# Patient Record
Sex: Male | Born: 2000
Health system: Southern US, Community
[De-identification: ages and names within clinical notes are randomized; demographics above are authoritative.]

## PROBLEM LIST (undated history)

## (undated) DIAGNOSIS — Z789 Other specified health status: Secondary | ICD-10-CM

## (undated) HISTORY — PX: NO PAST SURGERIES: SHX2092

---
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
## 2020-03-03 ENCOUNTER — Telehealth: Payer: Self-pay | Admitting: Pharmacy Technician

## 2020-03-03 ENCOUNTER — Ambulatory Visit: Payer: Medicaid Other

## 2020-03-03 NOTE — Telephone Encounter (Signed)
RCID Patient Product/process development scientist completed.    The patient is insured through [employer] and has a $0 copay.    Nathan Tucker. Nathan Tucker CPhT Specialty Pharmacy Patient [facility] for Infectious Disease Phone: [phone] Fax:  [phone]

## 2020-03-16 ENCOUNTER — Ambulatory Visit: Payer: Medicaid Other | Admitting: Internal Medicine

## 2020-05-23 ENCOUNTER — Emergency Department (HOSPITAL_COMMUNITY)
Admission: EM | Admit: 2020-05-23 | Discharge: 2020-05-23 | Discharge status: Against Medical Advice | Payer: Medicaid Other

## 2020-05-23 ENCOUNTER — Other Ambulatory Visit: Payer: Self-pay

## 2020-05-27 ENCOUNTER — Other Ambulatory Visit: Payer: Self-pay

## 2020-05-27 ENCOUNTER — Ambulatory Visit (HOSPITAL_COMMUNITY)
Admission: EM | Admit: 2020-05-27 | Discharge: 2020-05-27 | Disposition: A | Discharge status: Home / Self Care | Payer: Medicaid Other | Attending: Family Medicine | Admitting: Family Medicine

## 2020-05-27 ENCOUNTER — Encounter (HOSPITAL_COMMUNITY): Payer: Self-pay | Admitting: Emergency Medicine

## 2020-05-27 DIAGNOSIS — Z202 Contact with and (suspected) exposure to infections with a predominantly sexual mode of transmission: Secondary | ICD-10-CM

## 2020-05-27 DIAGNOSIS — B20 Human immunodeficiency virus [HIV] disease: Secondary | ICD-10-CM

## 2020-05-27 LAB — HIV ANTIBODY (ROUTINE TESTING W REFLEX): HIV Screen 4th Generation wRfx: REACTIVE — AB

## 2020-05-27 MED ORDER — LIDOCAINE HCL (PF) 1 % IJ SOLN
INTRAMUSCULAR | Status: AC
  Filled 2020-05-27: qty 2

## 2020-05-27 MED ORDER — CEFTRIAXONE SODIUM 500 MG IJ SOLR
INTRAMUSCULAR | Status: AC
  Filled 2020-05-27: qty 500

## 2020-05-27 MED ORDER — CEFTRIAXONE SODIUM 500 MG IJ SOLR
500.0000 mg | Freq: Once | INTRAMUSCULAR | Status: AC
  Administered 2020-05-27: 500 mg via INTRAMUSCULAR

## 2020-05-27 NOTE — Discharge Instructions (Signed)
Please schedule an appointment and follow up as soon as possible with Infectious Disease  [facility] [address] [place], [place] [address] [phone]

## 2020-05-27 NOTE — ED Provider Notes (Signed)
[facility]    CSN: [id number] Arrival date & time: 05/27/20  1159      History   Chief Complaint Chief Complaint  Patient presents with  . Exposure to STD    HPI Nathan Tucker is a 19 y.o. male.   Here today concerned about possible gonorrhea infection as his sexual partner just tested positive last week and they have had unprotected sexual interaction. He is currently asymptomatic. Denies penile discharge, rashes, fever, pelvic or abdominal pain, N/V.      Past Medical History:  Diagnosis Date  . No pertinent past medical history     Patient Active Problem List   Diagnosis Date Noted  . Intractable vomiting with nausea 11/23/2019  . Intractable vomiting   . Left upper quadrant abdominal pain     Past Surgical History:  Procedure Laterality Date  . NO PAST SURGERIES         Home Medications    Prior to Admission medications   Medication Sig Start Date End Date Taking? Authorizing Provider  bictegravir-emtricitabine-tenofovir AF (BIKTARVY) 50-200-25 MG TABS tablet Take 1 tablet by mouth daily. 11/30/19   Randall Hiss, MD  capsaicin (ZOSTRIX) 0.025 % cream Apply topically 2 (two) times daily. 11/24/19   Mullis, Kiersten P, DO  dicyclomine (BENTYL) 20 MG tablet Take 1 tablet (20 mg total) by mouth 2 (two) times daily. 11/23/19   Joy, Shawn C, PA-C  ondansetron (ZOFRAN ODT) 4 MG disintegrating tablet Take 1 tablet (4 mg total) by mouth every 8 (eight) hours as needed for nausea or vomiting. 11/23/19   Joy, Hillard Danker, PA-C    Family History History reviewed. No pertinent family history.  Social History Social History   Tobacco Use  . Smoking status: Never Smoker  . Smokeless tobacco: Never Used  Substance Use Topics  . Alcohol use: Yes    Comment: pt reports 40 shots, $300 tab   . Drug use: Yes    Frequency: 2.0 times per week    Types: Marijuana     Allergies   Patient has no known allergies.   Review of Systems Review of  Systems PER HPI    Physical Exam Triage Vital Signs ED Triage Vitals  Enc Vitals Group     BP 05/27/20 1238 139/76     Pulse Rate 05/27/20 1238 74     Resp 05/27/20 1238 16     Temp 05/27/20 1238 99.2 F (37.3 C)     Temp Source 05/27/20 1238 Oral     SpO2 05/27/20 1238 98 %     Weight --      Height --      Head Circumference --      Peak Flow --      Pain Score 05/27/20 1237 0     Pain Loc --      Pain Edu? --      Excl. in GC? --    No data found.  Updated Vital Signs BP 139/76 (BP Location: Right Arm)   Pulse 74   Temp 99.2 F (37.3 C) (Oral)   Resp 16   SpO2 98%   Visual Acuity Right Eye Distance:   Left Eye Distance:   Bilateral Distance:    Right Eye Near:   Left Eye Near:    Bilateral Near:     Physical Exam Vitals and nursing note reviewed.  Constitutional:      Appearance: Normal appearance.  HENT:     Head:  Atraumatic.  Eyes:     Extraocular Movements: Extraocular movements intact.     Conjunctiva/sclera: Conjunctivae normal.  Cardiovascular:     Rate and Rhythm: Normal rate and regular rhythm.  Pulmonary:     Effort: Pulmonary effort is normal.     Breath sounds: Normal breath sounds.  Abdominal:     General: Bowel sounds are normal. There is no distension.     Palpations: Abdomen is soft.     Tenderness: There is no abdominal tenderness. There is no right CVA tenderness, left CVA tenderness or guarding.  Musculoskeletal:        General: Normal range of motion.     Cervical back: Normal range of motion and neck supple.  Skin:    General: Skin is warm and dry.  Neurological:     General: No focal deficit present.     Mental Status: He is oriented to person, place, and time.  Psychiatric:        Mood and Affect: Mood normal.        Thought Content: Thought content normal.        Judgment: Judgment normal.    UC Treatments / Results  Labs (all labs ordered are listed, but only abnormal results are displayed) Labs Reviewed  HIV  ANTIBODY (ROUTINE TESTING W REFLEX)  RPR  CYTOLOGY, (ORAL, ANAL, URETHRAL) ANCILLARY ONLY    EKG   Radiology No results found.  Procedures Procedures (including critical care time)  Medications Ordered in UC Medications  cefTRIAXone (ROCEPHIN) injection 500 mg (500 mg Intramuscular Given 05/27/20 1302)    Initial Impression / Assessment and Plan / UC Course  I have reviewed the triage vital signs and the nursing notes.  Pertinent labs & imaging results that were available during my care of the patient were reviewed by me and considered in my medical decision making (see chart for details).     Given close known exposure to gonorrhea, will administer IM rocephin today in clinic. Aptima swab and blood work pending to r/o other STIs. Of note, on record review it appears he was informed of new onset HIV infection back in April and has not yet followed up with infectious disease despite numerous attempts on their behalf to connect with him. Stressed the importance of this to him and provided contact information for him to schedule an appt ASAP. safe sexual practices discussed.   Final Clinical Impressions(s) / UC Diagnoses   Final diagnoses:  STD exposure  History of HIV infection [facility])     Discharge Instructions     Please schedule an appointment and follow up as soon as possible with Infectious Disease  [facility] [address] [place], [place] [address] [phone]    ED Prescriptions    None     PDMP not reviewed this encounter.   Particia Nearing, [place] 05/27/20 1357

## 2020-05-27 NOTE — ED Triage Notes (Signed)
Pt presents with STD exposure. States partner tested positive. Last exposed on Tuesday.

## 2020-05-28 LAB — RPR: RPR Ser Ql: NONREACTIVE

## 2020-05-29 LAB — CYTOLOGY, (ORAL, ANAL, URETHRAL) ANCILLARY ONLY
Chlamydia: NEGATIVE
Comment: NEGATIVE
Comment: NEGATIVE
Comment: NORMAL
Neisseria Gonorrhea: NEGATIVE
Trichomonas: NEGATIVE

## 2020-05-30 ENCOUNTER — Encounter: Payer: Self-pay | Admitting: Infectious Disease

## 2020-05-30 ENCOUNTER — Telehealth: Payer: Self-pay | Admitting: Infectious Disease

## 2020-05-30 DIAGNOSIS — A549 Gonococcal infection, unspecified: Secondary | ICD-10-CM

## 2020-05-30 DIAGNOSIS — B20 Human immunodeficiency virus [HIV] disease: Secondary | ICD-10-CM

## 2020-05-30 HISTORY — DX: Gonococcal infection, unspecified: A54.9

## 2020-05-30 HISTORY — DX: Human immunodeficiency virus (HIV) disease: B20

## 2020-05-30 LAB — HIV-1/2 AB - DIFFERENTIATION
HIV 1 Ab: POSITIVE — AB
HIV 2 Ab: NEGATIVE

## 2020-05-30 NOTE — Telephone Encounter (Signed)
  Have been trying to engage in care at [facility] since April of 2021  Tammy Sours probably already reached out to you MIchelle  DIS to try again?  Maybe next time in ER they might talk to him as well

## 2020-06-03 ENCOUNTER — Telehealth: Payer: Self-pay | Admitting: *Deleted

## 2020-06-03 NOTE — Telephone Encounter (Signed)
-----   Message from Veryl Speak, FNP sent at 05/30/2020  4:29 PM EDT ----- Looks like we may have tried to engage in care but has not followed up.   Thanks!

## 2020-06-03 NOTE — Telephone Encounter (Signed)
Main phone number listed is patient's mother.  She offered to take a message because patient doesn't have a phone right now and isn't with her (she has his SIM card until Monday). RN declined, his mother asked if I could call back Monday afternoon.  RN agreed. Andree Coss, RN

## 2020-06-06 NOTE — Telephone Encounter (Signed)
THx Michelle~!

## 2020-06-13 NOTE — Telephone Encounter (Signed)
Attempted to call patient at number listed. Call went to voicemail that was not yet set up. Andree Coss, RN

## 2020-07-20 NOTE — Telephone Encounter (Signed)
Left message on mobile number asking Kermit to call his doctor's office. Will send to [facility]. Andree Coss, RN

## 2020-09-21 ENCOUNTER — Encounter: Payer: Self-pay | Admitting: Physician Assistant

## 2020-09-21 ENCOUNTER — Telehealth: Payer: Medicaid Other | Admitting: Physician Assistant

## 2020-09-21 DIAGNOSIS — B20 Human immunodeficiency virus [HIV] disease: Secondary | ICD-10-CM

## 2020-09-21 NOTE — Progress Notes (Signed)
I connected with  Nathan Tucker on 09/21/20 by a video enabled telemedicine application and verified that I am speaking with the correct person using two identifiers.   I discussed the limitations of evaluation and management by telemedicine. The patient expressed understanding and agreed to proceed.    New Patient Office Visit  Subjective:  Patient ID: Nathan Tucker, male    DOB: [birth year]  Age: 20 y.o. MRN: [id number]  CC:  Chief Complaint  Patient presents with  . HIV Positive/AIDS   Virtual Visit via Video Note  I connected with Nathan Tucker on 09/21/20 at  7:30 PM EST by a video enabled telemedicine application and verified that I am speaking with the correct person using two identifiers.  Location: Patient: Home Provider: Working remotely from home   I discussed the limitations of evaluation and management by telemedicine and the availability of in person appointments. The patient expressed understanding and agreed to proceed.  History of Present Illness: Patient states that he was diagnosed with HIV approximately 1 year ago.  Reports that he has not started treatment, states that he feels he was in denial and a state of depression over the last year and was not ready to accept help.  Reports that he would like to start treatment at this time.   Observations/Objective: Medical history and current medications reviewed, no physical exam completed   Past Medical History:  Diagnosis Date  . Gonorrhea 05/30/2020  . HIV disease (HCC) 05/30/2020  . No pertinent past medical history     Past Surgical History:  Procedure Laterality Date  . NO PAST SURGERIES      History reviewed. No pertinent family history.  Social History   Socioeconomic History  . Marital status: Single    Spouse name: Not on file  . Number of children: Not on file  . Years of education: Not on file  . Highest education level: Not on file  Occupational History  . Not on file   Tobacco Use  . Smoking status: Never Smoker  . Smokeless tobacco: Never Used  Substance and Sexual Activity  . Alcohol use: Yes    Comment: pt reports 40 shots, $300 tab   . Drug use: Yes    Frequency: 2.0 times per week    Types: Marijuana  . Sexual activity: Yes    Partners: Female  Other Topics Concern  . Not on file  Social History Narrative  . Not on file   Social Determinants of Health   Financial Resource Strain: Not on file  Food Insecurity: Not on file  Transportation Needs: Not on file  Physical Activity: Not on file  Stress: Not on file  Social Connections: Not on file  Intimate Partner Violence: Not on file    ROS Review of Systems  Constitutional: Negative.   HENT: Negative.   Eyes: Negative.   Respiratory: Negative.   Cardiovascular: Negative.   Gastrointestinal: Negative.   Endocrine: Negative.   Genitourinary: Negative.   Musculoskeletal: Negative.   Skin: Negative.   Allergic/Immunologic: Negative.   Neurological: Negative.   Hematological: Negative.   Psychiatric/Behavioral: Negative.     Objective:   Today's Vitals: There were no vitals taken for this visit.    Assessment & Plan:   Problem List Items Addressed This Visit      Other   HIV disease (HCC) - Primary   Relevant Orders   Ambulatory referral to Infectious Disease      Outpatient Encounter Medications as of 09/21/2020  Medication Sig  . bictegravir-emtricitabine-tenofovir AF (BIKTARVY) 50-200-25 MG TABS tablet Take 1 tablet by mouth daily.  . capsaicin (ZOSTRIX) 0.025 % cream Apply topically 2 (two) times daily.  Marland Kitchen dicyclomine (BENTYL) 20 MG tablet Take 1 tablet (20 mg total) by mouth 2 (two) times daily.  . ondansetron (ZOFRAN ODT) 4 MG disintegrating tablet Take 1 tablet (4 mg total) by mouth every 8 (eight) hours as needed for nausea or vomiting.   No facility-administered encounter medications on file as of 09/21/2020.    Assessment and Plan: 1. HIV disease  [facility]) Congratulated patient on his decision, gave patient information for infectious disease for him to be able to reach out to them as well. - Ambulatory referral to Infectious Disease   Follow Up Instructions:    I discussed the assessment and treatment plan with the patient. The patient was provided an opportunity to ask questions and all were answered. The patient agreed with the plan and demonstrated an understanding of the instructions.   The patient was advised to call back or seek an in-person evaluation if the symptoms worsen or if the condition fails to improve as anticipated.   Follow-up: Return if symptoms worsen or fail to improve.   Kasandra Knudsen Oris Staffieri, PA-C

## 2020-09-21 NOTE — Patient Instructions (Signed)
I sent a referral for you to be seen at [facility] for infectious disease.  Their phone number is [phone].  They will be very happy to hear from you.  Please let us know if there is anything else we can do for you  Roney Jaffe, PA-C Physician Assistant [facility] [email or web address]

## 2020-09-29 ENCOUNTER — Encounter: Payer: Self-pay | Admitting: Infectious Diseases

## 2020-09-29 ENCOUNTER — Ambulatory Visit: Payer: Medicaid Other

## 2020-09-29 ENCOUNTER — Ambulatory Visit (INDEPENDENT_AMBULATORY_CARE_PROVIDER_SITE_OTHER): Payer: Medicaid Other | Admitting: Infectious Diseases

## 2020-09-29 ENCOUNTER — Other Ambulatory Visit: Payer: Self-pay

## 2020-09-29 VITALS — BP 128/86 | HR 79 | Temp 98.2°F | Wt 206.0 lb

## 2020-09-29 DIAGNOSIS — Z716 Tobacco abuse counseling: Secondary | ICD-10-CM | POA: Diagnosis not present

## 2020-09-29 DIAGNOSIS — Z23 Encounter for immunization: Secondary | ICD-10-CM

## 2020-09-29 DIAGNOSIS — Z7185 Encounter for immunization safety counseling: Secondary | ICD-10-CM | POA: Diagnosis not present

## 2020-09-29 DIAGNOSIS — B20 Human immunodeficiency virus [HIV] disease: Secondary | ICD-10-CM

## 2020-09-29 MED ORDER — BIKTARVY 50-200-25 MG PO TABS: 1.0000 | ORAL_TABLET | Freq: Every day | ORAL | 4 refills | Status: AC

## 2020-09-29 NOTE — Progress Notes (Addendum)
[place], [place], [place], [address]                                                                  Phn. [phone]; Fax: [phone]                                                                             Date: 09/29/20  Reason for Visit: HIV Rapid start  Requesting provider: Dion Body   HPI: Nathan Tucker is a 20 y.o.old male with a history of HIV and Gonorrhea who is here for rapid start of HIV. Patient seems to have been diagnosed with HIV in 11/23/2019. However, he has not been able to establish care with HIV provider since diagnosis. He says he went to a state of depression was not ready to come forward for care. He was told by a friend recently to get started on treatment and hence he is here. He says he did not have any symptoms when he was diagnosed with HIV. Denies having h/o STIs in the past like GC or syphilis. Denies being started on treatment yet   He says he is Bisexual, last sexual activity was in June 2021.  Feels anxious when he meets new people or feels sad due to stress at work but denies any suicidal or homicidal ideas. He declined to see [facility] counselor.   No recent hospital admissions/sickness  Born in [place], moved to [place]  8 years ago around 2009 because of better opportunities. No travel out of the [place]. No travel out of [place] recently.  Smokes marijuana everyday, alcohol on weekends, no IVDU Live with mother, works in [place].  PMH: Depression PSH: None Allergies - NKDA Medications- none Family h/o - not sure   ROS: Denies dysphagia, odynophagia, cough, fever, nausea, vomiting, diarrhea, constipation, weight loss, chills, night sweats, recent hospitalizations, rashes, joint complaints, shortness of breath, headaches, chest pain, abdominal pain,  dysuria .   Past Medical History:  Diagnosis Date  . Gonorrhea 05/30/2020  . HIV disease ([place]) 05/30/2020  . No pertinent past medical history    Past Surgical History:  Procedure Laterality Date  . NO PAST SURGERIES     Social History   Socioeconomic History  . Marital status: Single    Spouse name: Not on file  . Number of children: Not on file  . Years of education: Not on file  . Highest education level: Not on file  Occupational History  . Not on file  Tobacco Use  . Smoking status: Never Smoker  . Smokeless tobacco: Never Used  Substance and Sexual Activity  .  Alcohol use: Yes    Comment: pt reports 40 shots, $300 tab   . Drug use: Yes    Frequency: 2.0 times per week    Types: Marijuana  . Sexual activity: Yes    Partners: Female  Other Topics Concern  . Not on file  Social History Narrative  . Not on file   Social Determinants of Health   Financial Resource Strain: Not on file  Food Insecurity: Not on file  Transportation Needs: Not on file  Physical Activity: Not on file  Stress: Not on file  Social Connections: Not on file  Intimate Partner Violence: Not on file   Vitals  BP 128/86   Pulse 79   Temp 98.2 F (36.8 C) (Oral)   Wt 206 lb (93.4 kg)   BMI 31.32 kg/m   Examination  Gen: Alert and oriented x 3, no acute distress HEENT: [place]/AT, PERL, EOMI, no scleral icterus, no pale conjunctivae, hearing normal, oral mucosa moist, no oral thrush  Neck: Supple, no lymphadenopathy Cardio: Regular rate and rhythm; +S1 and S2; no murmurs, gallops, or rubs Resp: CTAB; no wheezes, rhonchi, or rales GI: Soft, nontender, nondistended, bowel sounds present GU: Musc: Extremities: No cyanosis, clubbing, or edema; +2 PT and DP pulses Skin: No rashes, lesions, or ecchymoses Neuro: No focal deficits Psych: Calm, cooperative  Lab Results HIV 1 RNA Quant (copies/mL)  Date Value  11/24/2019 1,380,000   No results found for: HIV1GENOSEQ Lab Results   Component Value Date   WBC 6.2 11/23/2019   HGB 14.6 11/23/2019   HCT 44.6 11/23/2019   MCV 87.6 11/23/2019   PLT 213 11/23/2019    Lab Results  Component Value Date   CREATININE 0.82 11/23/2019   BUN 10 11/23/2019   NA 141 11/23/2019   K 3.5 11/23/2019   CL 105 11/23/2019   CO2 25 11/23/2019   Lab Results  Component Value Date   ALT 24 11/23/2019   AST 40 11/23/2019   ALKPHOS 51 11/23/2019   BILITOT 0.7 11/23/2019     Lab Results  Component Value Date   CHLAMYDIAWP Negative 05/27/2020   N Negative 05/27/2020    Health Maintenance:  There is no immunization history on file for this patient.   Assessment/Plan: HIV Discussed with patient treatment options and side effects, benefits of treatment, long term outcomes.  Discussed the severity of untreated HIV including higher cancer risk, opportunistic infections, renal failure.  Discussed needing to use condoms, partner disclosure, necessary vaccines, blood monitoring.     Start Biktarvy Meet THP Fu in 6 weeks  Orders Placed This Encounter  Procedures  . Flu Vaccine QUAD 36+ mos IM  . CBC  . Comprehensive metabolic panel  . HIV-1 RNA ultraquant reflex to gentyp+  . RPR  . T-helper cell (CD4)- ([facility] clinic only)  . Lipid panel  . Hepatitis B core antibody, total  . Hepatitis B surface antibody,qualitative  . Hepatitis B surface antigen  . Hepatitis C antibody  . QuantiFERON-TB Gold Plus  . HLA B*5701    Smoking/Alcohol Counseled   STD Screening  Urine GC , oral GC and RPR Declined anal GC  Immunization Discussed recommended vaccines in [place]  He says he follows up with a dentist   I have personally spent 45 minutes involved in face-to-face and non-face-to-face activities for this patient on the day of the visit. Professional time spent includes the following activities, in addition to those noted in the documentation: Preparing to see the patient (  review of tests), Obtaining and/or reviewing  separately obtained history (admission/discharge record), Performing a medically appropriate examination and/or evaluation , Ordering medications/tests/procedures, referring and communicating with other health care professionals, Documenting clinical information in the EMR or other health record, Independently interpreting results (not separately reported), Communicating results to the patient/family/caregiver, Counseling and educating the patient/family/caregiver and Care coordination (not separately reported).   Patient's labs were reviewed as well as his previous records. Patients questions were addressed and answered. Safe sex counseling done.   Electronically signed by:  Rosiland Oz, MD Infectious Disease Physician [facility] [address] [place], [place] [address] Phone: [phone]  Fax: [phone]

## 2020-09-30 LAB — T-HELPER CELL (CD4) - (RCID CLINIC ONLY)
CD4 % Helper T Cell: 27 % — ABNORMAL LOW (ref 33–65)
CD4 T Cell Abs: 373 /uL — ABNORMAL LOW (ref 400–1790)

## 2020-10-07 LAB — CBC
HCT: 43 % (ref 38.5–50.0)
Hemoglobin: 14.4 g/dL (ref 13.2–17.1)
MCH: 29.4 pg (ref 27.0–33.0)
MCHC: 33.5 g/dL (ref 32.0–36.0)
MCV: 87.9 fL (ref 80.0–100.0)
MPV: 10.7 fL (ref 7.5–12.5)
Platelets: 236 10*3/uL (ref 140–400)
RBC: 4.89 10*6/uL (ref 4.20–5.80)
RDW: 13.1 % (ref 11.0–15.0)
WBC: 5.3 10*3/uL (ref 3.8–10.8)

## 2020-10-07 LAB — COMPREHENSIVE METABOLIC PANEL
AG Ratio: 1.3 (calc) (ref 1.0–2.5)
ALT: 21 U/L (ref 8–46)
AST: 22 U/L (ref 12–32)
Albumin: 4.7 g/dL (ref 3.6–5.1)
Alkaline phosphatase (APISO): 56 U/L (ref 46–169)
BUN: 12 mg/dL (ref 7–20)
CO2: 30 mmol/L (ref 20–32)
Calcium: 9.8 mg/dL (ref 8.9–10.4)
Chloride: 104 mmol/L (ref 98–110)
Creat: 0.86 mg/dL (ref 0.60–1.26)
Globulin: 3.6 g/dL (calc) — ABNORMAL HIGH (ref 2.1–3.5)
Glucose, Bld: 88 mg/dL (ref 65–99)
Potassium: 4.1 mmol/L (ref 3.8–5.1)
Sodium: 141 mmol/L (ref 135–146)
Total Bilirubin: 0.4 mg/dL (ref 0.2–1.1)
Total Protein: 8.3 g/dL — ABNORMAL HIGH (ref 6.3–8.2)

## 2020-10-07 LAB — QUANTIFERON-TB GOLD PLUS
Mitogen-NIL: >10 IU/mL
NIL: 0.03 IU/mL
QuantiFERON-TB Gold Plus: NEGATIVE
TB1-NIL: 0.03 IU/mL
TB2-NIL: 0.05 IU/mL

## 2020-10-07 LAB — HIV-1 RNA ULTRAQUANT REFLEX TO GENTYP+
HIV 1 RNA Quant: 46600 copies/mL — ABNORMAL HIGH
HIV-1 RNA Quant, Log: 4.67 Log copies/mL — ABNORMAL HIGH

## 2020-10-07 LAB — HIV-1 GENOTYPE: HIV-1 Genotype: DETECTED — AB

## 2020-10-07 LAB — LIPID PANEL
Cholesterol: 189 mg/dL — ABNORMAL HIGH (ref <170)
HDL: 40 mg/dL — ABNORMAL LOW (ref >45)
LDL Cholesterol (Calc): 135 mg/dL (calc) — ABNORMAL HIGH (ref <110)
Non-HDL Cholesterol (Calc): 149 mg/dL (calc) — ABNORMAL HIGH (ref <120)
Total CHOL/HDL Ratio: 4.7 (calc) (ref <5.0)
Triglycerides: 45 mg/dL (ref <90)

## 2020-10-07 LAB — HEPATITIS C ANTIBODY
Hepatitis C Ab: NONREACTIVE
SIGNAL TO CUT-OFF: 0.06 (ref <1.00)

## 2020-10-07 LAB — HEPATITIS B SURFACE ANTIBODY,QUALITATIVE: Hep B S Ab: NONREACTIVE

## 2020-10-07 LAB — HEPATITIS B SURFACE ANTIGEN: Hepatitis B Surface Ag: NONREACTIVE

## 2020-10-07 LAB — RPR: RPR Ser Ql: NONREACTIVE

## 2020-10-07 LAB — HLA B*5701: HLA-B*5701 w/rflx HLA-B High: NEGATIVE

## 2020-10-07 LAB — HEPATITIS B CORE ANTIBODY, TOTAL: Hep B Core Total Ab: NONREACTIVE

## 2020-10-13 DIAGNOSIS — Z20822 Contact with and (suspected) exposure to covid-19: Secondary | ICD-10-CM | POA: Diagnosis not present

## 2020-10-21 ENCOUNTER — Encounter: Payer: Self-pay | Admitting: Internal Medicine

## 2020-10-31 ENCOUNTER — Emergency Department (HOSPITAL_COMMUNITY): Payer: Medicaid Other

## 2020-10-31 ENCOUNTER — Emergency Department (HOSPITAL_COMMUNITY)
Admission: EM | Admit: 2020-10-31 | Discharge: 2020-10-31 | Disposition: A | Discharge status: Home / Self Care | Payer: Medicaid Other | Attending: Emergency Medicine | Admitting: Emergency Medicine

## 2020-10-31 ENCOUNTER — Encounter (HOSPITAL_COMMUNITY): Payer: Self-pay | Admitting: Emergency Medicine

## 2020-10-31 ENCOUNTER — Other Ambulatory Visit: Payer: Self-pay

## 2020-10-31 DIAGNOSIS — Z21 Asymptomatic human immunodeficiency virus [HIV] infection status: Secondary | ICD-10-CM | POA: Diagnosis not present

## 2020-10-31 DIAGNOSIS — R1031 Right lower quadrant pain: Secondary | ICD-10-CM | POA: Diagnosis not present

## 2020-10-31 DIAGNOSIS — R112 Nausea with vomiting, unspecified: Secondary | ICD-10-CM | POA: Insufficient documentation

## 2020-10-31 DIAGNOSIS — R1084 Generalized abdominal pain: Secondary | ICD-10-CM | POA: Diagnosis not present

## 2020-10-31 DIAGNOSIS — E871 Hypo-osmolality and hyponatremia: Secondary | ICD-10-CM | POA: Diagnosis not present

## 2020-10-31 DIAGNOSIS — Z79899 Other long term (current) drug therapy: Secondary | ICD-10-CM | POA: Insufficient documentation

## 2020-10-31 DIAGNOSIS — R1032 Left lower quadrant pain: Secondary | ICD-10-CM | POA: Diagnosis not present

## 2020-10-31 DIAGNOSIS — E860 Dehydration: Secondary | ICD-10-CM | POA: Diagnosis not present

## 2020-10-31 DIAGNOSIS — K5900 Constipation, unspecified: Secondary | ICD-10-CM | POA: Insufficient documentation

## 2020-10-31 DIAGNOSIS — R1111 Vomiting without nausea: Secondary | ICD-10-CM | POA: Diagnosis not present

## 2020-10-31 LAB — CBC WITH DIFFERENTIAL/PLATELET
Abs Immature Granulocytes: 0.1 10*3/uL — ABNORMAL HIGH (ref 0.00–0.07)
Basophils Absolute: 0 10*3/uL (ref 0.0–0.1)
Basophils Relative: 0 %
Eosinophils Absolute: 0 10*3/uL (ref 0.0–0.5)
Eosinophils Relative: 0 %
HCT: 41.3 % (ref 39.0–52.0)
Hemoglobin: 14.3 g/dL (ref 13.0–17.0)
Lymphocytes Relative: 7 %
Lymphs Abs: 0.7 10*3/uL (ref 0.7–4.0)
MCH: 29.9 pg (ref 26.0–34.0)
MCHC: 34.6 g/dL (ref 30.0–36.0)
MCV: 86.4 fL (ref 80.0–100.0)
Monocytes Absolute: 0.3 10*3/uL (ref 0.1–1.0)
Monocytes Relative: 3 %
Myelocytes: 1 %
Neutro Abs: 9.5 10*3/uL — ABNORMAL HIGH (ref 1.7–7.7)
Neutrophils Relative %: 89 %
Platelets: 203 10*3/uL (ref 150–400)
RBC: 4.78 MIL/uL (ref 4.22–5.81)
RDW: 12.8 % (ref 11.5–15.5)
WBC: 10.7 10*3/uL — ABNORMAL HIGH (ref 4.0–10.5)
nRBC: 0 % (ref 0.0–0.2)
nRBC: 0 /100 WBC

## 2020-10-31 LAB — COMPREHENSIVE METABOLIC PANEL
ALT: 24 U/L (ref 0–44)
AST: 55 U/L — ABNORMAL HIGH (ref 15–41)
Albumin: 3.7 g/dL (ref 3.5–5.0)
Alkaline Phosphatase: 50 U/L (ref 38–126)
Anion gap: 11 (ref 5–15)
BUN: 11 mg/dL (ref 6–20)
CO2: 22 mmol/L (ref 22–32)
Calcium: 8.4 mg/dL — ABNORMAL LOW (ref 8.9–10.3)
Chloride: 98 mmol/L (ref 98–111)
Creatinine, Ser: 1.06 mg/dL (ref 0.61–1.24)
GFR, Estimated: >60 mL/min (ref >60)
Glucose, Bld: 95 mg/dL (ref 70–99)
Potassium: 4.1 mmol/L (ref 3.5–5.1)
Sodium: 131 mmol/L — ABNORMAL LOW (ref 135–145)
Total Bilirubin: 1.5 mg/dL — ABNORMAL HIGH (ref 0.3–1.2)
Total Protein: 8.2 g/dL — ABNORMAL HIGH (ref 6.5–8.1)

## 2020-10-31 LAB — URINALYSIS, ROUTINE W REFLEX MICROSCOPIC
Bacteria, UA: NONE SEEN
Bilirubin Urine: NEGATIVE
Glucose, UA: NEGATIVE mg/dL
Ketones, ur: 80 mg/dL — AB
Leukocytes,Ua: NEGATIVE
Nitrite: NEGATIVE
Protein, ur: NEGATIVE mg/dL
Specific Gravity, Urine: >1.046 — ABNORMAL HIGH (ref 1.005–1.030)
pH: 6 (ref 5.0–8.0)

## 2020-10-31 LAB — LIPASE, BLOOD: Lipase: 29 U/L (ref 11–51)

## 2020-10-31 MED ORDER — ONDANSETRON HCL 4 MG PO TABS: 4.0000 mg | ORAL_TABLET | Freq: Three times a day (TID) | ORAL | 0 refills | Status: DC | PRN

## 2020-10-31 MED ORDER — METOCLOPRAMIDE HCL 5 MG/ML IJ SOLN
10.0000 mg | Freq: Once | INTRAMUSCULAR | Status: AC
  Administered 2020-10-31: 10 mg via INTRAVENOUS
  Filled 2020-10-31: qty 2

## 2020-10-31 MED ORDER — IOHEXOL 300 MG/ML  SOLN
100.0000 mL | Freq: Once | INTRAMUSCULAR | Status: AC | PRN
  Administered 2020-10-31: 100 mL via INTRAVENOUS

## 2020-10-31 MED ORDER — SODIUM CHLORIDE 0.9 % IV BOLUS
1000.0000 mL | Freq: Once | INTRAVENOUS | Status: AC
  Administered 2020-10-31: 1000 mL via INTRAVENOUS

## 2020-10-31 NOTE — ED Notes (Signed)
Gave pt crackers and gingerale for PO challenge °

## 2020-10-31 NOTE — ED Provider Notes (Signed)
[facility] EMERGENCY DEPARTMENT Provider Note   CSN: [id number] Arrival date & time: 10/31/20  1506     History Chief Complaint  Patient presents with  . Vomiting    Nathan Tucker is a 20 y.o. male.  The history is provided by the patient and medical records. No language interpreter was used.   [name] is a 20 y.o. male who presents to the Emergency Department complaining of vomiting. He presents to the emergency department by EMS for evaluation of one week of persistent vomiting. He began feeling poorly about one week ago with headaches, hot flashes and sweating spells. He had a temperature to 99. Headache has completely resolved. He then developed nausea, vomiting and fatigue. He has been unable to tolerate any liquids for the last week. He denies any diarrhea. He does have associated constipation. He also reports right lower quadrant abdominal pain for the last week. No chest pain, shortness of breath, cough, dysuria. He was diagnosed with HIV last fall but has been unable to get follow-up for medications. Symptoms are moderate and constant in nature.    Past Medical History:  Diagnosis Date  . Gonorrhea 05/30/2020  . HIV disease (HCC) 05/30/2020  . No pertinent past medical history     Patient Active Problem List   Diagnosis Date Noted  . Immunization counseling 09/29/2020  . Encounter for smoking cessation counseling 09/29/2020  . HIV disease (HCC) 05/30/2020  . Gonorrhea 05/30/2020  . Intractable vomiting with nausea 11/23/2019  . Intractable vomiting   . Left upper quadrant abdominal pain     Past Surgical History:  Procedure Laterality Date  . NO PAST SURGERIES         No family history on file.  Social History   Tobacco Use  . Smoking status: Never Smoker  . Smokeless tobacco: Never Used  Substance Use Topics  . Alcohol use: Yes    Comment: pt reports 40 shots, $300 tab   . Drug use: Yes    Frequency: 2.0 times per  week    Types: Marijuana    Home Medications Prior to Admission medications   Medication Sig Start Date End Date Taking? Authorizing Provider  ondansetron (ZOFRAN) 4 MG tablet Take 1 tablet (4 mg total) by mouth every 8 (eight) hours as needed for nausea or vomiting. 10/31/20  Yes Tilden Fossa, MD  bictegravir-emtricitabine-tenofovir AF (BIKTARVY) 50-200-25 MG TABS tablet Take 1 tablet by mouth daily. 09/29/20   Odette Fraction, MD  capsaicin (ZOSTRIX) 0.025 % cream Apply topically 2 (two) times daily. Patient not taking: Reported on 09/29/2020 11/24/19   Orpah Cobb P, DO  dicyclomine (BENTYL) 20 MG tablet Take 1 tablet (20 mg total) by mouth 2 (two) times daily. Patient not taking: Reported on 09/29/2020 11/23/19   Joy, Shawn C, PA-C  ondansetron (ZOFRAN ODT) 4 MG disintegrating tablet Take 1 tablet (4 mg total) by mouth every 8 (eight) hours as needed for nausea or vomiting. Patient not taking: Reported on 09/29/2020 11/23/19   Anselm Pancoast, PA-C    Allergies    Patient has no known allergies.  Review of Systems   Review of Systems  All other systems reviewed and are negative.   Physical Exam Updated Vital Signs BP (!) 145/80 (BP Location: Right Arm)   Pulse 80   Temp 99.7 F (37.6 C) (Oral)   Resp 18   SpO2 98%   Physical Exam Vitals and nursing note reviewed.  Constitutional:  Appearance: He is well-developed.  HENT:     Head: Normocephalic and atraumatic.  Cardiovascular:     Rate and Rhythm: Normal rate and regular rhythm.     Heart sounds: No murmur heard.   Pulmonary:     Effort: Pulmonary effort is normal. No respiratory distress.     Breath sounds: Normal breath sounds.  Abdominal:     Palpations: Abdomen is soft.     Tenderness: There is no guarding or rebound.     Comments: Mild lower abdominal tenderness  Musculoskeletal:        General: No tenderness.  Skin:    General: Skin is warm and dry.  Neurological:     Mental Status: He is alert and  oriented to person, place, and time.  Psychiatric:        Behavior: Behavior normal.     ED Results / Procedures / Treatments   Labs (all labs ordered are listed, but only abnormal results are displayed) Labs Reviewed  COMPREHENSIVE METABOLIC PANEL - Abnormal; Notable for the following components:      Result Value   Sodium 131 (*)    Calcium 8.4 (*)    Total Protein 8.2 (*)    AST 55 (*)    Total Bilirubin 1.5 (*)    All other components within normal limits  CBC WITH DIFFERENTIAL/PLATELET - Abnormal; Notable for the following components:   WBC 10.7 (*)    Neutro Abs 9.5 (*)    Abs Immature Granulocytes 0.10 (*)    All other components within normal limits  URINALYSIS, ROUTINE W REFLEX MICROSCOPIC - Abnormal; Notable for the following components:   Specific Gravity, Urine >1.046 (*)    Hgb urine dipstick SMALL (*)    Ketones, ur 80 (*)    All other components within normal limits  LIPASE, BLOOD  CRYPTOCOCCAL ANTIGEN    EKG None  Radiology CT Abdomen Pelvis W Contrast  Result Date: 10/31/2020 CLINICAL DATA:  Right lower quadrant pain. EXAM: CT ABDOMEN AND PELVIS WITH CONTRAST TECHNIQUE: Multidetector CT imaging of the abdomen and pelvis was performed using the standard protocol following bolus administration of intravenous contrast. CONTRAST:  OMNIPAQUE IOHEXOL 300 MG/ML  SOLN COMPARISON:  November 23, 2019 FINDINGS: Lower chest: No acute abnormality. Hepatobiliary: No focal liver abnormality is seen. No gallstones, gallbladder wall thickening, or biliary dilatation. Pancreas: Unremarkable. No pancreatic ductal dilatation or surrounding inflammatory changes. Spleen: Normal in size without focal abnormality. Adrenals/Urinary Tract: Adrenal glands are unremarkable. Kidneys are normal, without renal calculi, focal lesion, or hydronephrosis. Bladder is unremarkable. Stomach/Bowel: Stomach is within normal limits. Appendix appears normal. No evidence of bowel wall thickening,  distention, or inflammatory changes. Vascular/Lymphatic: No significant vascular findings are present. No enlarged abdominal or pelvic lymph nodes. Reproductive: Prostate is unremarkable. Other: No abdominal wall hernia or abnormality. No abdominopelvic ascites. Musculoskeletal: No acute or significant osseous findings. IMPRESSION: No CT evidence of acute or active process within the abdomen or pelvis. Electronically Signed   By: Aram Candela M.D.   On: 10/31/2020 16:43    Procedures Procedures   Medications Ordered in ED Medications  sodium chloride 0.9 % bolus 1,000 mL (0 mLs Intravenous Stopped 10/31/20 1635)  iohexol (OMNIPAQUE) 300 MG/ML solution 100 mL (100 mLs Intravenous Contrast Given 10/31/20 1632)  sodium chloride 0.9 % bolus 1,000 mL (0 mLs Intravenous Stopped 10/31/20 2132)  metoCLOPramide (REGLAN) injection 10 mg (10 mg Intravenous Given 10/31/20 2054)    ED Course  I have reviewed  the triage vital signs and the nursing notes.  Pertinent labs & imaging results that were available during my care of the patient were reviewed by me and considered in my medical decision making (see chart for details).    MDM Rules/Calculators/A&P                         patient with history of HIV, not on antiretrovirals at this point here for evaluation of one week of vomiting, nausea and lower abdominal discomfort. He is non-toxic appearing on evaluation. He has mild lower abdominal tenderness without peritoneal findings. Labs with mild hyponatremia consistent with dehydration. CBC with minimal leukocytosis. CT abdomen pelvis is negative for acute abnormality. UA is not consistent with UTI. Following treatment with IV fluids he is feeling improved. Plan to discharge home with antiemetics. Discussed importance of ID follow-up to arrange medications. Return precautions discussed.  Final Clinical Impression(s) / ED Diagnoses Final diagnoses:  Nausea and vomiting in adult    Rx / DC Orders ED  Discharge Orders         Ordered    ondansetron (ZOFRAN) 4 MG tablet  Every 8 hours PRN        10/31/20 2140           Tilden Fossa, MD 10/31/20 2326

## 2020-10-31 NOTE — ED Triage Notes (Addendum)
Pt arrives via EMS from home for vomiting x5 days. Pt recently diagnosed with HIV, not on any meds at this time. 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 EMS gave 4mg  zofran and NS. HR 110, BP 140/80, CBG 120

## 2020-11-01 ENCOUNTER — Encounter: Payer: Medicaid Other | Admitting: Family Medicine

## 2020-11-01 ENCOUNTER — Telehealth: Payer: Medicaid Other | Admitting: Family Medicine

## 2020-11-01 ENCOUNTER — Telehealth: Payer: Self-pay

## 2020-11-01 ENCOUNTER — Encounter: Payer: Self-pay | Admitting: Family Medicine

## 2020-11-01 DIAGNOSIS — R1011 Right upper quadrant pain: Secondary | ICD-10-CM

## 2020-11-01 DIAGNOSIS — B20 Human immunodeficiency virus [HIV] disease: Secondary | ICD-10-CM

## 2020-11-01 DIAGNOSIS — R112 Nausea with vomiting, unspecified: Secondary | ICD-10-CM | POA: Diagnosis not present

## 2020-11-01 DIAGNOSIS — R1110 Vomiting, unspecified: Secondary | ICD-10-CM | POA: Insufficient documentation

## 2020-11-01 LAB — CRYPTOCOCCAL ANTIGEN: Crypto Ag: NEGATIVE

## 2020-11-01 NOTE — Telephone Encounter (Signed)
Ok. Thanks!

## 2020-11-01 NOTE — Progress Notes (Signed)
[name], Nathan Tucker are scheduled for a virtual visit with your provider today.    Just as we do with appointments in the office, we must obtain your consent to participate.  Your consent will be active for this visit and any virtual visit you may have with one of our providers in the next 365 days.    If you have a MyChart account, I can also send a copy of this consent to you electronically.  All virtual visits are billed to your insurance company just like a traditional visit in the office.  As this is a virtual visit, video technology does not allow for your provider to perform a traditional examination.  This may limit your provider's ability to fully assess your condition.  If your provider identifies any concerns that need to be evaluated in person or the need to arrange testing such as labs, EKG, etc, we will make arrangements to do so.    Although advances in technology are sophisticated, we cannot ensure that it will always work on either your end or our end.  If the connection with a video visit is poor, we may have to switch to a telephone visit.  With either a video or telephone visit, we are not always able to ensure that we have a secure connection.   I need to obtain your verbal consent now.   Are you willing to proceed with your visit today?   Nathan Tucker has provided verbal consent on 11/01/2020 for a virtual visit (video or telephone).   Nathan Finner, NP 11/01/2020  1:30 PM   Date:  11/01/2020   ID:  Nathan Tucker, DOB [birth year], MRN [id number]  Patient Location: Home Provider Location: Home Office   Participants: Patient and Provider for Visit and Wrap up  Method of visit: Video  Location of Patient: Home Location of Provider: Home Office Consent was obtain for visit over the video. Services rendered by provider: Visit was performed via video  A video enabled telemedicine application was used and I verified that I am speaking with the correct person using two  identifiers.  PCP:  Diamantina Monks, MD   Chief Complaint:  On going nausea and right side pain  History of Present Illness:    Nathan Tucker is a 20 y.o. male with history as stated below. Presents video telehealth for an acute care visit secondary to right side pain that is on going with nausea and at times vomiting.  Was just seen in the ED (see Epic for full note) yesterday unremarkable CT scan. Elevated AST, WBC mildly elevated, HIV count, and decreased CD4 - reports nottaking his HIV medications. He also seemed to be mildly dehydrated- with hyponatremia. UA negative. He felt much better with IV fluids and oral zofran. D/c home with this. Reports history of drinking alcohol, but has not in a while per him. Reports recently eating at a restaurant with a low health score as well.  Reports taking the zofran with mild relief, but then once he eats he is sick again. Has not ben using a bland diet as of yet.   Has upcoming appt with ID on 3/24- but was offered an earlier one it looks like in Mychart.  He reports associated symptoms including some loose stools now.  No shortness of breath, chest pain, cough, dysuria, or back pain noted.  Past Medical, Surgical, Social History, Allergies, and Medications have been Reviewed.  Past Medical History:  Diagnosis Date  . Gonorrhea 05/30/2020  .  HIV disease (HCC) 05/30/2020  . No pertinent past medical history     No outpatient medications have been marked as taking for the 11/01/20 encounter (Video Visit) with Nathan Finner, NP.     Allergies:   Patient has no known allergies.   ROS See HPI for history of present illness.  Physical Exam Constitutional:      Appearance: Normal appearance.  HENT:     Head: Normocephalic and atraumatic.     Right Ear: External ear normal.     Left Ear: External ear normal.     Nose: Nose normal.  Eyes:     Conjunctiva/sclera: Conjunctivae normal.  Pulmonary:     Comments: No shortness of  breath in conversation Musculoskeletal:        General: Normal range of motion.     Cervical back: Normal range of motion.  Neurological:     Mental Status: He is alert and oriented to person, place, and time.  Psychiatric:        Mood and Affect: Mood normal.        Behavior: Behavior normal.        Thought Content: Thought content normal.        Judgment: Judgment normal.               A&P  1. Non-intractable vomiting with nausea, unspecified vomiting type -S&S appear to be same as they were in ED with mild improvement -continue zofran -advised to stop drinking alcohol -given elevated AST- might have been exposed to a GI virus and needs time to clear -hydration and bland diet enouraged -advised f/u with ID and precautions reviewed for ED return  -Patient acknowledged agreement and understanding of the plan.    2. Right upper quadrant abdominal pain -S&S appear to be same as they were in ED with mild improvement -continue zofran -advised to stop drinking alcohol -given elevated AST- might have been exposed to a GI virus and needs time to clear -hydration and bland diet enouraged -advised f/u with ID and precautions reviewed for ED return  -Patient acknowledged agreement and understanding of the plan.     3. HIV disease (HCC) -advised to follow up and try to get back on his medications -Patient acknowledged agreement and understanding of the plan.      Time:   Today, I have spent 15  minutes with the patient with telehealth technology discussing the above problems, reviewing the chart, previous notes, medications and orders.    Tests Ordered: No orders of the defined types were placed in this encounter.   Medication Changes: No orders of the defined types were placed in this encounter.    Disposition:  Follow up w ID  Signed, Nathan Finner, NP  11/01/2020 1:30 PM

## 2020-11-01 NOTE — Telephone Encounter (Signed)
Attempted to call patient to schedule appointment per Dr. Renold Don. First phone number not in service. RN tried second number and left HIPAA compliant voicemail requesting call back.   Sandie Ano, RN

## 2020-11-01 NOTE — Patient Instructions (Signed)
Bland Diet A bland diet consists of foods that are often soft and do not have a lot of fat, fiber, or extra seasonings. Foods without fat, fiber, or seasoning are easier for the body to digest. They are also less likely to irritate your mouth, throat, stomach, and other parts of your digestive system. A bland diet is sometimes called a BRAT diet. What is my plan? Your health care provider or food and nutrition specialist (dietitian) may recommend specific changes to your diet to prevent symptoms or to treat your symptoms. These changes may include:  Eating small meals often.  Cooking food until it is soft enough to chew easily.  Chewing your food well.  Drinking fluids slowly.  Not eating foods that are very spicy, sour, or fatty.  Not eating citrus fruits, such as oranges and grapefruit. What do I need to know about this diet?  Eat a variety of foods from the bland diet food list.  Do not follow a bland diet longer than needed.  Ask your health care provider whether you should take vitamins or supplements. What foods can I eat? Grains Hot cereals, such as cream of wheat. Rice. Bread, crackers, or tortillas made from refined white flour.   Vegetables Canned or cooked vegetables. Mashed or boiled potatoes. Fruits Bananas. Applesauce. Other types of cooked or canned fruit with the skin and seeds removed, such as canned peaches or pears.   Meats and other proteins Scrambled eggs. Creamy peanut butter or other nut butters. Lean, well-cooked meats, such as chicken or fish. Tofu. Soups or broths.   Dairy Low-fat dairy products, such as milk, cottage cheese, or yogurt. Beverages Water. Herbal tea. Apple juice.   Fats and oils Mild salad dressings. Canola or olive oil. Sweets and desserts Pudding. Custard. Fruit gelatin. Ice cream. The items listed above may not be a complete list of recommended foods and beverages. Contact a dietitian for more options. What foods are not  recommended? Grains Whole grain breads and cereals. Vegetables Raw vegetables. Fruits Raw fruits, especially citrus, berries, or dried fruits. Dairy Whole fat dairy foods. Beverages Caffeinated drinks. Alcohol. Seasonings and condiments Strongly flavored seasonings or condiments. Hot sauce. Salsa. Other foods Spicy foods. Fried foods. Sour foods, such as pickled or fermented foods. Foods with high sugar content. Foods high in fiber. The items listed above may not be a complete list of foods and beverages to avoid. Contact a dietitian for more information. Summary  A bland diet consists of foods that are often soft and do not have a lot of fat, fiber, or extra seasonings.  Foods without fat, fiber, or seasoning are easier for the body to digest.  Check with your health care provider to see how long you should follow this diet plan. It is not meant to be followed for long periods. This information is not intended to replace advice given to you by your health care provider. Make sure you discuss any questions you have with your health care provider. Document Revised: 09/04/2017 Document Reviewed: 09/04/2017 Elsevier Patient Education  2021 Elsevier Inc.  

## 2020-11-01 NOTE — Telephone Encounter (Signed)
Patient returned call and place on Dr. Leafy Half schedule for tomorrow.  Nathan Tucker

## 2020-11-01 NOTE — Telephone Encounter (Signed)
-----   Message from Raymondo Band, MD sent at 11/01/2020  8:32 AM EDT ----- Regarding: Urgent appointment Hi team, please see if you could schedule with dr Elinor Parkinson this [facility] Skeeter next week... If no open slots, then can schedule with me... Thanks  For acute visit

## 2020-11-01 NOTE — Progress Notes (Signed)
No showed appt  This encounter was created in error - please disregard.

## 2020-11-02 ENCOUNTER — Telehealth (INDEPENDENT_AMBULATORY_CARE_PROVIDER_SITE_OTHER): Payer: Medicaid Other | Admitting: Infectious Diseases

## 2020-11-02 DIAGNOSIS — Z113 Encounter for screening for infections with a predominantly sexual mode of transmission: Secondary | ICD-10-CM

## 2020-11-02 DIAGNOSIS — B20 Human immunodeficiency virus [HIV] disease: Secondary | ICD-10-CM

## 2020-11-02 DIAGNOSIS — R112 Nausea with vomiting, unspecified: Secondary | ICD-10-CM

## 2020-11-02 NOTE — Progress Notes (Signed)
Virtual Visit via Telephone Note  I connected with@ on 11/02/20 at  3:30 PM EDT by a telephone enabled telemedicine application and verified that I am speaking with the correct person using two identifiers.  Location: Patient: Home  Provider: [facility]   I discussed the limitations of evaluation and management by telemedicine and the availability of in person appointments. The patient expressed understanding and agreed to proceed.  [facility]  Patient Active Problem List   Diagnosis Date Noted  . Non-intractable vomiting 11/01/2020  . Right upper quadrant abdominal pain 11/01/2020  . Immunization counseling 09/29/2020  . Encounter for smoking cessation counseling 09/29/2020  . HIV disease (HCC) 05/30/2020  . Gonorrhea 05/30/2020  . Intractable vomiting with nausea 11/23/2019  . Intractable vomiting   . Left upper quadrant abdominal pain     Patient's Medications  New Prescriptions   No medications on file  Previous Medications   BICTEGRAVIR-EMTRICITABINE-TENOFOVIR AF (BIKTARVY) 50-200-25 MG TABS TABLET    Take 1 tablet by mouth daily.   ONDANSETRON (ZOFRAN) 4 MG TABLET    Take 1 tablet (4 mg total) by mouth every 8 (eight) hours as needed for nausea or vomiting.  Modified Medications   No medications on file  Discontinued Medications   CAPSAICIN (ZOSTRIX) 0.025 % CREAM    Apply topically 2 (two) times daily.   DICYCLOMINE (BENTYL) 20 MG TABLET    Take 1 tablet (20 mg total) by mouth 2 (two) times daily.   ONDANSETRON (ZOFRAN ODT) 4 MG DISINTEGRATING TABLET    Take 1 tablet (4 mg total) by mouth every 8 (eight) hours as needed for nausea or vomiting.    History of Present Illness: Patient was scheduled as an In person visit after his recent ED visit where he was noted to be not taking ART. He was seen in the ED 3/14 for Nausea/vomiting for 1 week. He was given IVF. CT abdomen/pelvis was negative for acute findings and he was discharged on  anti-emetics. He says he had eaten chinese food before having nausea/vomiting and thinks he probably had a food poisoning. He feels better today. Nausea is mild, no vomiting. Denies fevers, chills , sweats. Abdominal pain is getting better. Denies diarrhea. Headache is also better.  In terms of his HIV, he tells me [place] immediately after last clinic appt in 2/10 and has not taken Biktarvy until yesterday when he got back home. Discussed about importance of being compliant with ART for continued viral suppression. He verbalised understanding.   Lasrt VL in 09/29/20 was 46, 600 and CD4 was 373 (27%) in 09/29/20  ROS: 10 point ROS done with pertinent positives and negative listed above  Past Medical History:  Diagnosis Date  . Gonorrhea 05/30/2020  . HIV disease (HCC) 05/30/2020  . No pertinent past medical history     Social History   Tobacco Use  . Smoking status: Never Smoker  . Smokeless tobacco: Never Used  Substance Use Topics  . Alcohol use: Yes    Comment: pt reports 40 shots, $300 tab   . Drug use: Yes    Frequency: 2.0 times per week    Types: Marijuana    No family history on file.  No Known Allergies  Health Maintenance  Topic Date Due  . HPV VACCINES (1 - Risk male 3-dose series) Never done  . COVID-19 Vaccine (1) Never done  . TETANUS/TDAP  Never done  . INFLUENZA VACCINE  Completed  . Hepatitis C Screening  Completed  . HIV Screening  Completed    Observations/Objective: Able to speak without any difficulty   Assessment and Plan: HIV disease Nausea and vomiting ? Food poisoning - improving   Follow Up Instructions: Continue Biktarvy  Fu in 6 weeks for lab visit. I will put standing orders ( HIV VL, RPR, Urine GC, Oral and Anal GC) Fu in 8-10 weeks  I discussed the assessment and treatment plan with the patient. The patient was provided an opportunity to ask questions and all were answered. The patient agreed with the plan  and demonstrated an understanding of the instructions.   The patient was advised to call back or seek an in-person evaluation if the symptoms worsen or if the condition fails to improve as anticipated.  I provided 20 minutes of non-face-to-face time during this encounter.  Victoriano Lain, MD [facility] Phone [phone] Fax no. [phone]  11/02/2020, 4:49 PM

## 2020-11-10 ENCOUNTER — Ambulatory Visit: Payer: Medicaid Other | Admitting: Infectious Diseases

## 2021-01-30 ENCOUNTER — Other Ambulatory Visit: Payer: Self-pay | Admitting: Family

## 2021-01-30 ENCOUNTER — Telehealth: Payer: Self-pay

## 2021-01-30 DIAGNOSIS — B20 Human immunodeficiency virus [HIV] disease: Secondary | ICD-10-CM

## 2021-01-30 NOTE — Telephone Encounter (Signed)
Called patient to get an appointment scheduled for a follow up, number is not in service

## 2021-01-30 NOTE — Telephone Encounter (Signed)
Attempted to contact patient to offer routine follow up visit. NO answer, No voicemail left as patient does not have a secured line. Will send patient a Mychar message and also have scheduling staff to follow up. Valarie Cones

## 2021-01-30 NOTE — Telephone Encounter (Signed)
-----   Message from Odette Fraction, MD sent at 01/29/2021  2:32 PM EDT ----- Regarding: HIV fu Please schedule a HUV follow up with me for this patient asap. Thanks.

## 2021-03-20 ENCOUNTER — Ambulatory Visit (INDEPENDENT_AMBULATORY_CARE_PROVIDER_SITE_OTHER): Payer: Medicaid Other

## 2021-03-20 ENCOUNTER — Ambulatory Visit
Admission: EM | Admit: 2021-03-20 | Discharge: 2021-03-20 | Disposition: A | Discharge status: Home / Self Care | Payer: Medicaid Other | Attending: Urgent Care | Admitting: Urgent Care

## 2021-03-20 ENCOUNTER — Other Ambulatory Visit: Payer: Self-pay

## 2021-03-20 DIAGNOSIS — S60459A Superficial foreign body of unspecified finger, initial encounter: Secondary | ICD-10-CM | POA: Diagnosis not present

## 2021-03-20 DIAGNOSIS — T07XXXA Unspecified multiple injuries, initial encounter: Secondary | ICD-10-CM | POA: Diagnosis not present

## 2021-03-20 DIAGNOSIS — Z23 Encounter for immunization: Secondary | ICD-10-CM

## 2021-03-20 DIAGNOSIS — S61210A Laceration without foreign body of right index finger without damage to nail, initial encounter: Secondary | ICD-10-CM | POA: Diagnosis not present

## 2021-03-20 DIAGNOSIS — S61411A Laceration without foreign body of right hand, initial encounter: Secondary | ICD-10-CM

## 2021-03-20 DIAGNOSIS — M79641 Pain in right hand: Secondary | ICD-10-CM

## 2021-03-20 DIAGNOSIS — S61421A Laceration with foreign body of right hand, initial encounter: Secondary | ICD-10-CM | POA: Diagnosis not present

## 2021-03-20 MED ORDER — TRAMADOL HCL 50 MG PO TABS: 50.0000 mg | ORAL_TABLET | Freq: Four times a day (QID) | ORAL | 0 refills | Status: DC | PRN

## 2021-03-20 MED ORDER — TETANUS-DIPHTH-ACELL PERTUSSIS 5-2.5-18.5 LF-MCG/0.5 IM SUSY
0.5000 mL | PREFILLED_SYRINGE | Freq: Once | INTRAMUSCULAR | Status: AC
Start: 2021-03-20 — End: 2021-03-20
  Administered 2021-03-20: 0.5 mL via INTRAMUSCULAR
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
## 2021-05-24 ENCOUNTER — Telehealth: Payer: Self-pay

## 2021-05-24 NOTE — Telephone Encounter (Signed)
Called patient to get appointment scheduled, left voicemail for patient to call back and get scheduled

## 2021-06-22 ENCOUNTER — Ambulatory Visit: Payer: Medicaid Other | Admitting: Infectious Diseases

## 2021-08-17 ENCOUNTER — Ambulatory Visit: Payer: Medicaid Other | Admitting: Infectious Diseases

## 2021-09-13 ENCOUNTER — Telehealth: Payer: Self-pay

## 2021-09-13 NOTE — Telephone Encounter (Signed)
Called patient to offer appointment, patient disconnected the call.   Sandie Ano, RN

## 2022-05-13 IMAGING — CT CT ABD-PELV W/ CM
2 of 4 series · 17 of 46 positions shown, 19 images · IV contrast (Omni 300)
Comparison: November 23, 2019

CLINICAL DATA: Right lower quadrant pain.

EXAM:
CT ABDOMEN AND PELVIS WITH CONTRAST
TECHNIQUE: Multidetector CT imaging of the abdomen and pelvis was performed
using the standard protocol following bolus administration of
intravenous contrast.
CONTRAST:  100mL OMNIPAQUE IOHEXOL 300 MG/ML  SOLN

[Series 3: a/p w/ 5mm · axial · 0.77mm/px · z∈[-682,-297]mm · 14 of 85 slices shown, 16 images]
[im 4/85  soft-tissue]
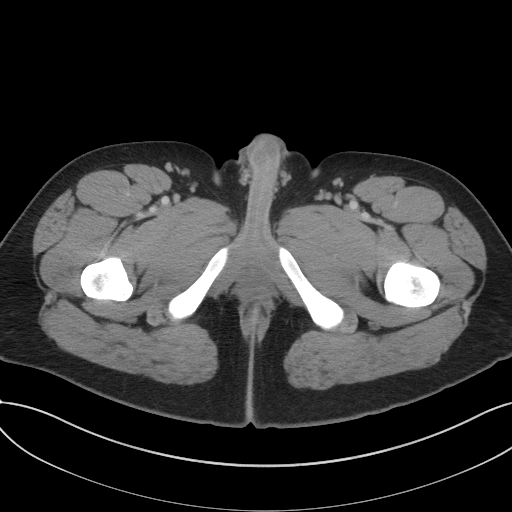
[im 4/85  bone]
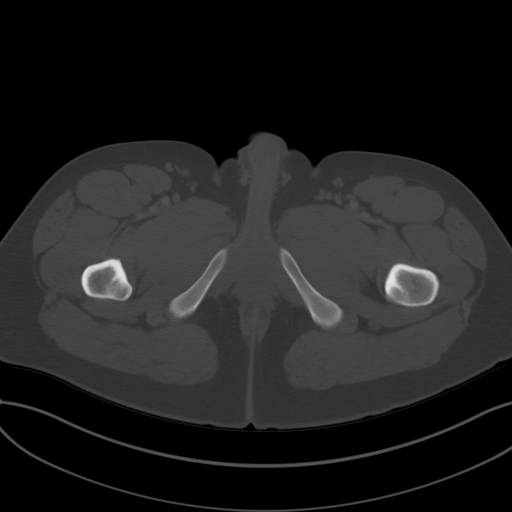
[im 10/85  soft-tissue]
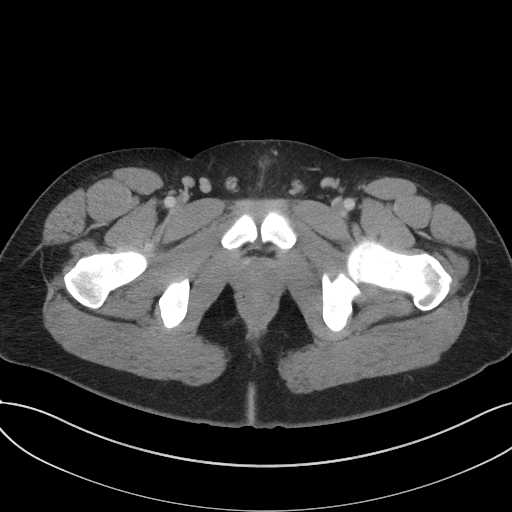
[im 17/85  soft-tissue]
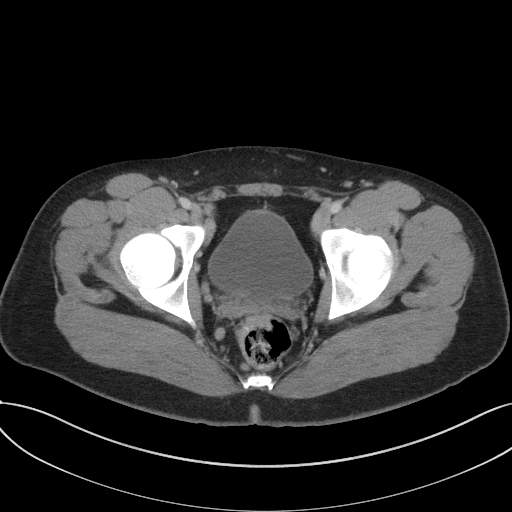
[im 23/85  soft-tissue]
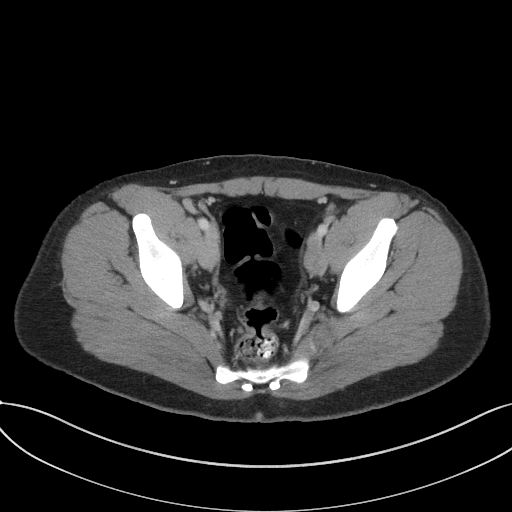
[im 30/85  soft-tissue]
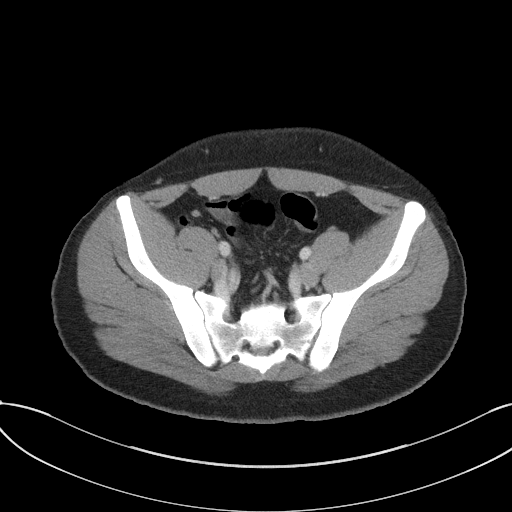
[im 33/85  soft-tissue]
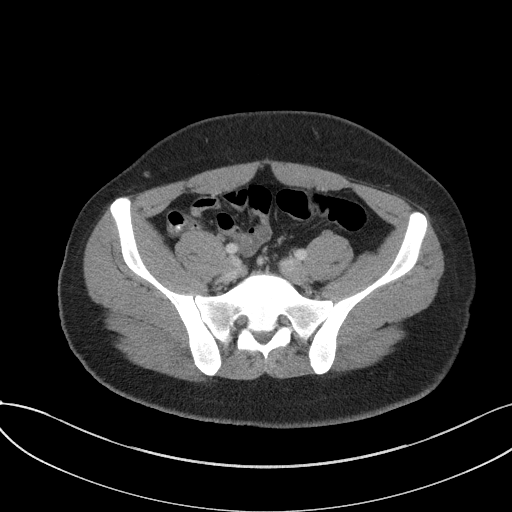
[im 39/85  soft-tissue]
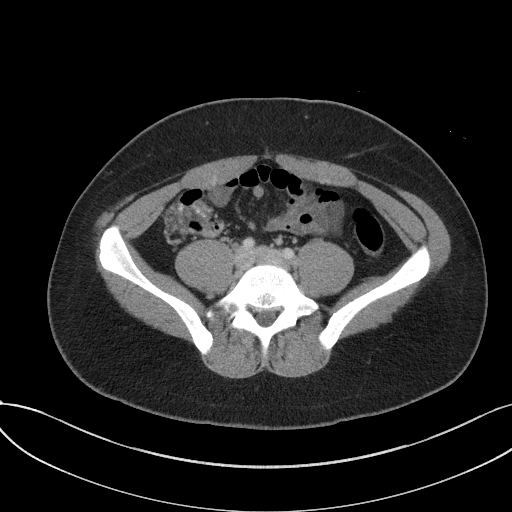
[im 46/85  soft-tissue]
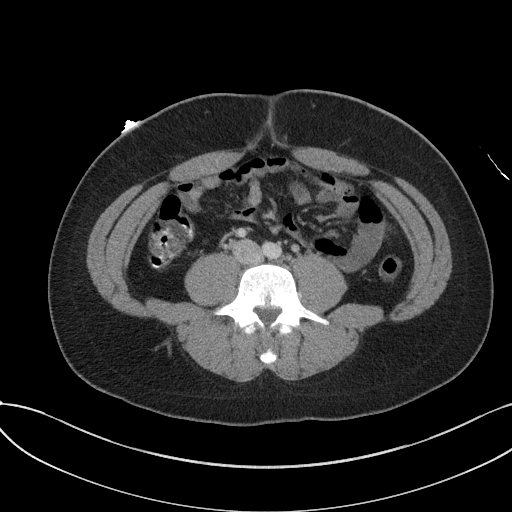
[im 52/85  soft-tissue]
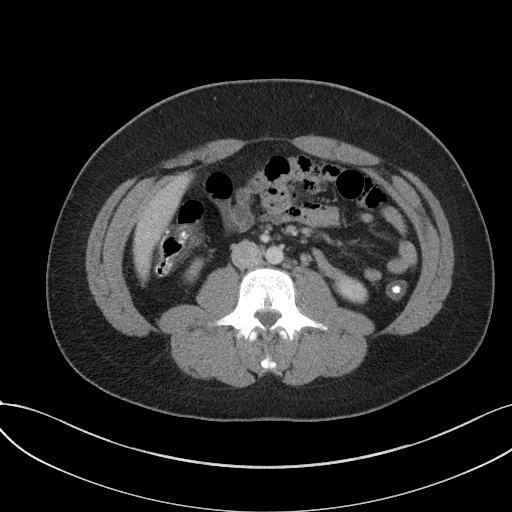
[im 52/85  bone]
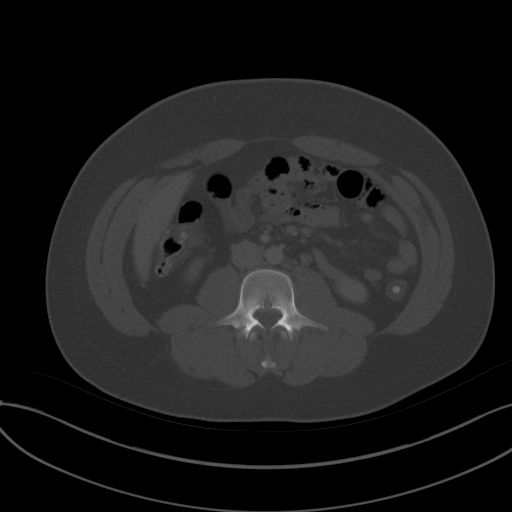
[im 55/85  soft-tissue]
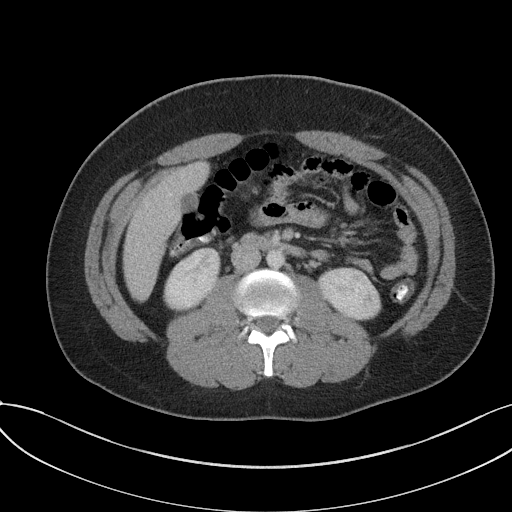
[im 62/85  soft-tissue]
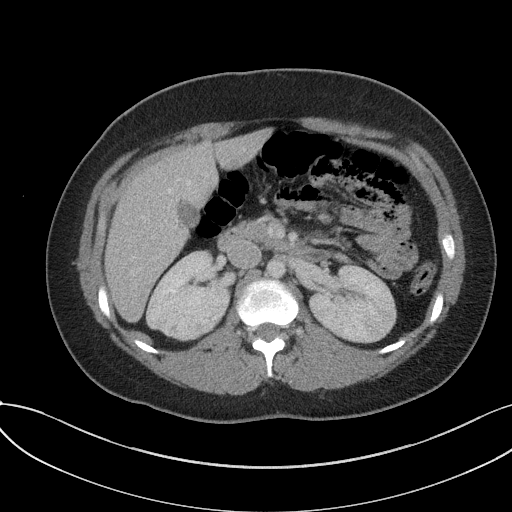
[im 68/85  soft-tissue]
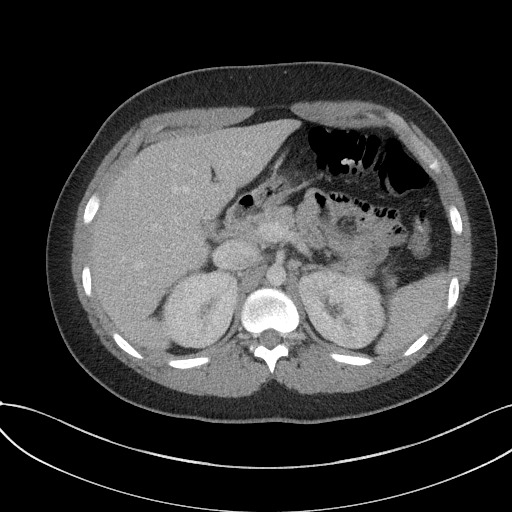
[im 75/85  soft-tissue]
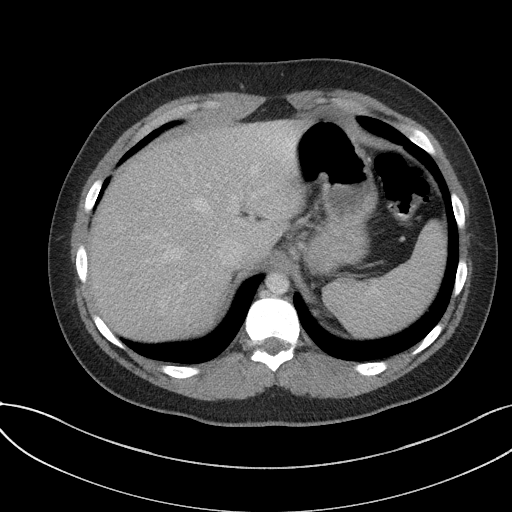
[im 81/85  soft-tissue]
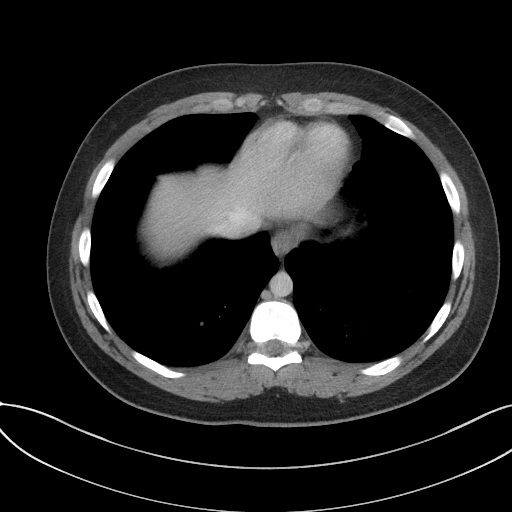

[Series 6: a/p w/ cor · coronal · 0.82mm/px · 3 of 151 slices shown]
[im 51/151  soft-tissue]
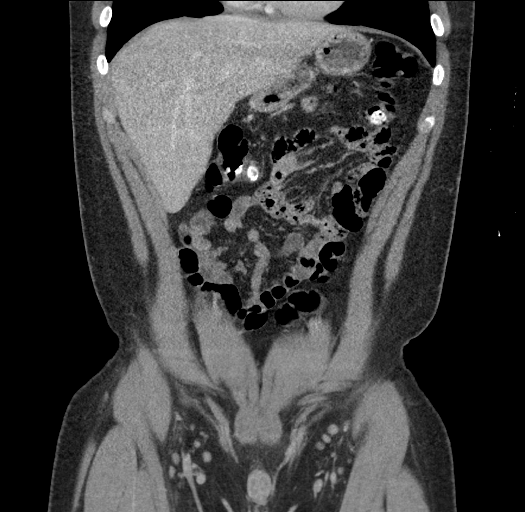
[im 67/151  soft-tissue]
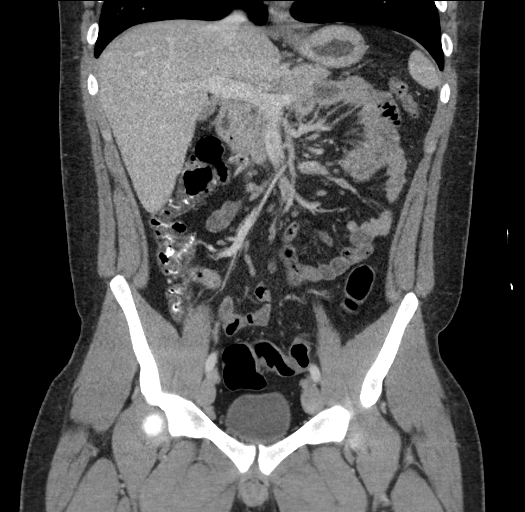
[im 84/151  soft-tissue]
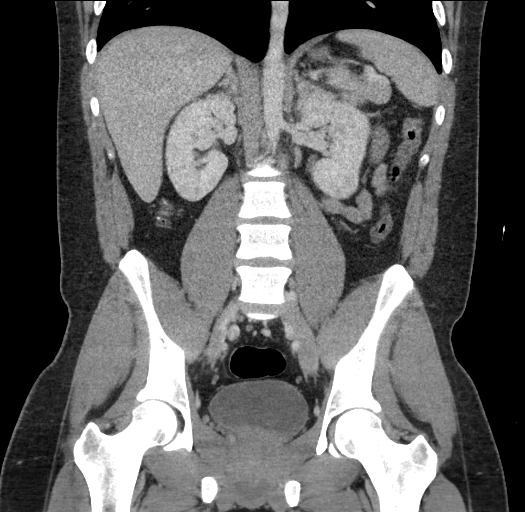

[17 of 46 positions shown; findings below may reference images not displayed]

FINDINGS: Lower chest: No acute abnormality.

Hepatobiliary: No focal liver abnormality is seen. No gallstones,
gallbladder wall thickening, or biliary dilatation.

Pancreas: Unremarkable. No pancreatic ductal dilatation or
surrounding inflammatory changes.

Spleen: Normal in size without focal abnormality.

Adrenals/Urinary Tract: Adrenal glands are unremarkable. Kidneys are
normal, without renal calculi, focal lesion, or hydronephrosis.
Bladder is unremarkable.

Stomach/Bowel: Stomach is within normal limits. Appendix appears
normal. No evidence of bowel wall thickening, distention, or
inflammatory changes.

Vascular/Lymphatic: No significant vascular findings are present. No
enlarged abdominal or pelvic lymph nodes.

Reproductive: Prostate is unremarkable.

Other: No abdominal wall hernia or abnormality. No abdominopelvic
ascites.

Musculoskeletal: No acute or significant osseous findings.
IMPRESSION: No CT evidence of acute or active process within the abdomen or
pelvis.
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
## 2022-11-23 ENCOUNTER — Ambulatory Visit (INDEPENDENT_AMBULATORY_CARE_PROVIDER_SITE_OTHER): Payer: Medicaid Other

## 2022-11-23 ENCOUNTER — Ambulatory Visit (HOSPITAL_COMMUNITY)
Admission: EM | Admit: 2022-11-23 | Discharge: 2022-11-23 | Disposition: A | Discharge status: Home / Self Care | Payer: Medicaid Other | Attending: Emergency Medicine | Admitting: Emergency Medicine

## 2022-11-23 DIAGNOSIS — R0602 Shortness of breath: Secondary | ICD-10-CM | POA: Diagnosis not present

## 2022-11-23 DIAGNOSIS — B20 Human immunodeficiency virus [HIV] disease: Secondary | ICD-10-CM

## 2022-11-23 DIAGNOSIS — B9689 Other specified bacterial agents as the cause of diseases classified elsewhere: Secondary | ICD-10-CM

## 2022-11-23 DIAGNOSIS — R059 Cough, unspecified: Secondary | ICD-10-CM

## 2022-11-23 DIAGNOSIS — J069 Acute upper respiratory infection, unspecified: Secondary | ICD-10-CM

## 2022-11-23 DIAGNOSIS — R051 Acute cough: Secondary | ICD-10-CM

## 2022-11-23 MED ORDER — BENZONATATE 100 MG PO CAPS
100.0000 mg | ORAL_CAPSULE | Freq: Three times a day (TID) | ORAL | 0 refills | Status: DC
  Filled 2022-11-23: qty 21, 7d supply, fill #0

## 2022-11-23 MED ORDER — DOXYCYCLINE HYCLATE 100 MG PO CAPS
100.0000 mg | ORAL_CAPSULE | Freq: Two times a day (BID) | ORAL | 0 refills | Status: DC
  Filled 2022-11-23: qty 20, 10d supply, fill #0

## 2022-11-23 NOTE — ED Triage Notes (Signed)
Pt is here for cough,sob,headache, left side body pain, scratchy throat. Pt has been taking tylenol ands ome pink pills to hekp with cough spasm.

## 2022-11-23 NOTE — ED Provider Notes (Signed)
[facility]    CSN: [id number] Arrival date & time: 11/23/22  1647      History   Chief Complaint Chief Complaint  Patient presents with   Cough   Shortness of Breath    HPI Nathan Tucker is a 22 y.o. male.   Patient presents to clinic for cough, shortness of breath, sore throat, headache and left-sided rib cage pain for over a week now.  He thinks he had a fever yesterday because he was really warm.  Did not check his temperature.  He denies any history of any respiratory issues, he does smoke about 10 blunts of marijuana per day.  He does have a history of HIV infection, is not on an antiretroviral regimen.  Reports he would like to get restarted with this.   The history is provided by the patient and medical records.  Cough Associated symptoms: headaches, shortness of breath, sore throat and wheezing   Associated symptoms: no chest pain, no chills, no diaphoresis and no eye discharge   Shortness of Breath Associated symptoms: cough, headaches, sore throat and wheezing   Associated symptoms: no abdominal pain, no chest pain and no diaphoresis     Past Medical History:  Diagnosis Date   Gonorrhea 05/30/2020   HIV disease (HCC) 05/30/2020   No pertinent past medical history     Patient Active Problem List   Diagnosis Date Noted   Non-intractable vomiting 11/01/2020   Right upper quadrant abdominal pain 11/01/2020   Immunization counseling 09/29/2020   Encounter for smoking cessation counseling 09/29/2020   HIV disease 05/30/2020   Gonorrhea 05/30/2020   Intractable vomiting with nausea 11/23/2019   Intractable vomiting    Left upper quadrant abdominal pain     Past Surgical History:  Procedure Laterality Date   NO PAST SURGERIES         Home Medications    Prior to Admission medications   Medication Sig Start Date End Date Taking? Authorizing Provider  benzonatate (TESSALON) 100 MG capsule Take 1 capsule (100 mg total) by mouth every 8  (eight) hours. 11/23/22  Yes Rinaldo RatelGarrison, [place] N, FNP  doxycycline (VIBRAMYCIN) 100 MG capsule Take 1 capsule (100 mg total) by mouth 2 (two) times daily. 11/23/22  Yes Rinaldo RatelGarrison, [place] N, FNP  bictegravir-emtricitabine-tenofovir AF (BIKTARVY) 50-200-25 MG TABS tablet Take 1 tablet by mouth daily. 09/29/20   Odette FractionManandhar, Sabina, MD  ondansetron (ZOFRAN) 4 MG tablet Take 1 tablet (4 mg total) by mouth every 8 (eight) hours as needed for nausea or vomiting. 10/31/20   Tilden Fossaees, Elizabeth, MD  traMADol (ULTRAM) 50 MG tablet Take 1 tablet (50 mg total) by mouth every 6 (six) hours as needed. 03/20/21   Wallis BambergMani, Mario, PA-C    Family History No family history on file.  Social History Social History   Tobacco Use   Smoking status: Never   Smokeless tobacco: Never  Substance Use Topics   Alcohol use: Yes    Comment: pt reports 40 shots, $300 tab    Drug use: Yes    Frequency: 2.0 times per week    Types: Marijuana     Allergies   Patient has no known allergies.   Review of Systems Review of Systems  Constitutional:  Positive for appetite change and fatigue. Negative for chills and diaphoresis.  HENT:  Positive for sore throat. Negative for congestion, trouble swallowing and voice change.   Eyes:  Negative for discharge.  Respiratory:  Positive for cough, shortness of breath and  wheezing.   Cardiovascular:  Negative for chest pain.  Gastrointestinal:  Negative for abdominal pain.  Genitourinary:  Negative for dysuria and flank pain.  Musculoskeletal:  Positive for arthralgias.  Neurological:  Positive for headaches. Negative for syncope.     Physical Exam Triage Vital Signs ED Triage Vitals  Enc Vitals Group     BP 11/23/22 1704 122/78     Pulse Rate 11/23/22 1704 (!) 101     Resp 11/23/22 1704 16     Temp 11/23/22 1704 99.6 F (37.6 C)     Temp Source 11/23/22 1704 Oral     SpO2 --      Weight --      Height --      Head Circumference --      Peak Flow --      Pain Score 11/23/22  1702 5     Pain Loc --      Pain Edu? --      Excl. in GC? --    No data found.  Updated Vital Signs BP 122/78 (BP Location: Left Arm)   Pulse (!) 101   Temp 99.6 F (37.6 C) (Oral)   Resp 16   SpO2 97%   Visual Acuity Right Eye Distance:   Left Eye Distance:   Bilateral Distance:    Right Eye Near:   Left Eye Near:    Bilateral Near:     Physical Exam Vitals and nursing note reviewed.  Constitutional:      General: He is not in acute distress.    Appearance: He is well-developed.  HENT:     Head: Normocephalic and atraumatic.     Mouth/Throat:     Mouth: Mucous membranes are moist.     Pharynx: Uvula midline. Posterior oropharyngeal erythema present.     Tonsils: No tonsillar exudate or tonsillar abscesses. 1+ on the right. 1+ on the left.  Eyes:     Conjunctiva/sclera: Conjunctivae normal.  Cardiovascular:     Rate and Rhythm: Normal rate and regular rhythm.     Heart sounds: S1 normal and S2 normal. No murmur heard. Pulmonary:     Effort: Pulmonary effort is normal. No respiratory distress.     Breath sounds: Normal breath sounds. Decreased air movement present. No decreased breath sounds, wheezing, rhonchi or rales.  Chest:     Chest wall: Tenderness present.       Comments: Reports left sided tenderness and pain with deep inspiration to identified areas.  Abdominal:     General: Bowel sounds are normal.     Palpations: Abdomen is soft. There is no hepatomegaly or mass.     Tenderness: There is no abdominal tenderness. There is no guarding.  Musculoskeletal:        General: No swelling. Normal range of motion.     Cervical back: Neck supple.  Lymphadenopathy:     Cervical: Cervical adenopathy present.  Skin:    General: Skin is warm and dry.     Capillary Refill: Capillary refill takes less than 2 seconds.  Neurological:     General: No focal deficit present.     Mental Status: He is alert and oriented to person, place, and time.  Psychiatric:         Mood and Affect: Mood normal.        Behavior: Behavior is cooperative.      UC Treatments / Results  Labs (all labs ordered are listed, but only abnormal results are displayed)  Labs Reviewed - No data to display  EKG   Radiology DG Chest 2 View  Result Date: 11/23/2022 CLINICAL DATA:  Shortness of breath, cough, HIV. EXAM: CHEST - 2 VIEW COMPARISON:  None Available. FINDINGS: The heart size and mediastinal contours are within normal limits. Both lungs are clear. The visualized skeletal structures are unremarkable. IMPRESSION: No active cardiopulmonary disease. Electronically Signed   By: Darliss Cheney M.D.   On: 11/23/2022 17:35    Procedures Procedures (including critical care time)  Medications Ordered in UC Medications - No data to display  Initial Impression / Assessment and Plan / UC Course  I have reviewed the triage vital signs and the nursing notes.  Pertinent labs & imaging results that were available during my care of the patient were reviewed by me and considered in my medical decision making (see chart for details).  Vitals in triage reviewed, patient is hemodynamically stable.  Lungs vesicular posteriorly with an oxygenation of 97% at rest on room air.  Patient is HIV positive and not on ART.  He does smoke marijuana daily.  With concern of immunosuppression, over a week of illness and smoking, chest x-ray obtained.  Chest x-ray negative for infiltrate or acute infection.  Due to duration of symptoms and immunocompromise status will cover with doxycycline for a bacterial upper respiratory infection.  Referral given to restart with infectious disease to get restarted with ART.  Strict follow-up and emergency/return precautions discussed, patient verbalized understanding, no questions at this time.    Final Clinical Impressions(s) / UC Diagnoses   Final diagnoses:  Bacterial upper respiratory infection  Acute cough  HIV disease     Discharge Instructions       Your chest x-ray was negative for pneumonia.  I am covering you with doxycycline for bacterial upper respiratory infection due to your prolonged symptoms and immunocompromise state.  Please take all antibiotics as prescribed and until finished.  You can take them with food if they upset your stomach.  You take the [facility] as needed every 8 hours for your cough.  You can also alternate between Tylenol and ibuprofen every 4-6 hours for body aches, fevers and discomfort.  It is essential that you contact infectious disease for a follow-up appointment so they can redraw your labs, reassess and restart your treatment for HIV.  Please return to clinic or seek immediate care if you do not experience any improvement after the 10 days of antibiotics, if you develop any wheezing, worsening of shortness of breath, chest pain, fever despite medication, or any changes.      ED Prescriptions     Medication Sig Dispense Auth. Provider   benzonatate (TESSALON) 100 MG capsule Take 1 capsule (100 mg total) by mouth every 8 (eight) hours. 21 capsule Rinaldo Ratel, [place] N, [place]   doxycycline (VIBRAMYCIN) 100 MG capsule Take 1 capsule (100 mg total) by mouth 2 (two) times daily. 20 capsule Tasmin Exantus, [place] N, [place]      PDMP not reviewed this encounter.   Saleema Weppler, [place] N, [place] 11/23/22 [id number]

## 2022-11-23 NOTE — Discharge Instructions (Addendum)
Your chest x-ray was negative for pneumonia.  I am covering you with doxycycline for bacterial upper respiratory infection due to your prolonged symptoms and immunocompromise state.  Please take all antibiotics as prescribed and until finished.  You can take them with food if they upset your stomach.  You take the [facility] as needed every 8 hours for your cough.  You can also alternate between Tylenol and ibuprofen every 4-6 hours for body aches, fevers and discomfort.  It is essential that you contact infectious disease for a follow-up appointment so they can redraw your labs, reassess and restart your treatment for HIV.  Please return to clinic or seek immediate care if you do not experience any improvement after the 10 days of antibiotics, if you develop any wheezing, worsening of shortness of breath, chest pain, fever despite medication, or any changes.

## 2022-11-24 ENCOUNTER — Other Ambulatory Visit (HOSPITAL_COMMUNITY): Payer: Self-pay

## 2022-11-24 ENCOUNTER — Ambulatory Visit (HOSPITAL_COMMUNITY): Payer: Medicaid Other

## 2022-12-03 ENCOUNTER — Other Ambulatory Visit (HOSPITAL_COMMUNITY): Payer: Self-pay

## 2023-01-22 ENCOUNTER — Emergency Department (HOSPITAL_BASED_OUTPATIENT_CLINIC_OR_DEPARTMENT_OTHER)
Admission: EM | Admit: 2023-01-22 | Discharge: 2023-01-22 | Disposition: A | Discharge status: Home / Self Care | Payer: Medicaid Other | Attending: Emergency Medicine | Admitting: Emergency Medicine

## 2023-01-22 ENCOUNTER — Encounter (HOSPITAL_BASED_OUTPATIENT_CLINIC_OR_DEPARTMENT_OTHER): Payer: Self-pay

## 2023-01-22 ENCOUNTER — Other Ambulatory Visit: Payer: Self-pay

## 2023-01-22 DIAGNOSIS — Z113 Encounter for screening for infections with a predominantly sexual mode of transmission: Secondary | ICD-10-CM | POA: Diagnosis not present

## 2023-01-22 DIAGNOSIS — B20 Human immunodeficiency virus [HIV] disease: Secondary | ICD-10-CM | POA: Diagnosis not present

## 2023-01-22 NOTE — ED Notes (Signed)
Discharge paperwork given and verbally understood. 

## 2023-01-22 NOTE — Discharge Instructions (Addendum)
Your STI testing results will be available on the patient portal.

## 2023-01-22 NOTE — ED Provider Notes (Signed)
[place] EMERGENCY DEPARTMENT AT [facility] Provider Note   CSN: [id number] Arrival date & time: 01/22/23  1935     History  Chief Complaint  Patient presents with   SEXUALLY TRANSMITTED DISEASE    Nathan Tucker is a 22 y.o. male.  HPI   22 year old male presenting to the emergency department for routine STI screening.  The patient states that he was last sexually active 1 week ago.  He just got out of a relationship and would like to be screened for sexually transmitted infections.  He denies any symptoms at this time.  He denies any known STI exposures.  He consents to STI testing.  Home Medications Prior to Admission medications   Medication Sig Start Date End Date Taking? Authorizing Provider  benzonatate (TESSALON) 100 MG capsule Take 1 capsule (100 mg total) by mouth every 8 (eight) hours. 11/23/22   Garrison, [place] N, FNP  bictegravir-emtricitabine-tenofovir AF (BIKTARVY) 50-200-25 MG TABS tablet Take 1 tablet by mouth daily. 09/29/20   Odette Fraction, MD  doxycycline (VIBRAMYCIN) 100 MG capsule Take 1 capsule (100 mg total) by mouth 2 (two) times daily. 11/23/22   Garrison, [place] N, FNP  ondansetron (ZOFRAN) 4 MG tablet Take 1 tablet (4 mg total) by mouth every 8 (eight) hours as needed for nausea or vomiting. 10/31/20   Tilden Fossa, MD  traMADol (ULTRAM) 50 MG tablet Take 1 tablet (50 mg total) by mouth every 6 (six) hours as needed. 03/20/21   Wallis Bamberg, PA-C      Allergies    Patient has no known allergies.    Review of Systems   Review of Systems  All other systems reviewed and are negative.   Physical Exam Updated Vital Signs BP (!) 141/88 (BP Location: Right Arm)   Pulse 70   Temp 100.3 F (37.9 C) (Oral)   Resp 16   Ht 5\' 8"  (1.727 m)   Wt 86.2 kg   SpO2 93%   BMI 28.89 kg/m  Physical Exam Vitals and nursing note reviewed.  Constitutional:      General: He is not in acute distress. HENT:     Head: Normocephalic and atraumatic.   Eyes:     Conjunctiva/sclera: Conjunctivae normal.     Pupils: Pupils are equal, round, and reactive to light.  Cardiovascular:     Rate and Rhythm: Normal rate and regular rhythm.  Pulmonary:     Effort: Pulmonary effort is normal. No respiratory distress.  Abdominal:     General: There is no distension.     Tenderness: There is no guarding.  Genitourinary:    Comments: Deferred due to lack of symptoms Musculoskeletal:        General: No deformity or signs of injury.     Cervical back: Neck supple.  Skin:    Findings: No lesion or rash.  Neurological:     General: No focal deficit present.     Mental Status: He is alert. Mental status is at baseline.     ED Results / Procedures / Treatments   Labs (all labs ordered are listed, but only abnormal results are displayed) Labs Reviewed  HIV ANTIBODY (ROUTINE TESTING W REFLEX) - Abnormal; Notable for the following components:      Result Value   HIV Screen 4th Generation wRfx Reactive (*)    All other components within normal limits  RPR  HSV 2 ANTIBODY, IGG  HSV 1 ANTIBODY, IGG  HIV-1/2 AB - DIFFERENTIATION  GC/CHLAMYDIA PROBE AMP (  [place]) NOT AT [facility]    EKG None  Radiology No results found.  Procedures Procedures    Medications Ordered in ED Medications - No data to display  ED Course/ Medical Decision Making/ A&P                             Medical Decision Making Amount and/or Complexity of Data Reviewed Labs: ordered.    22 year old male presenting to the emergency department for routine STI screening.  The patient states that he was last sexually active 1 week ago.  He just got out of a relationship and would like to be screened for sexually transmitted infections.  He denies any symptoms at this time.  He denies any known STI exposures.  He consents to STI testing.  HIV, RPR, HSV 1 and 2 antibody blood test were collected and pending, GC/chlamydia urine test was collected and pending.  The  patient was advised to follow-up on the results on the patient portal.  Stable for discharge.  Pt is positive for HIV. He is currently on Biktarvy, but does not appear to be following with ID. Referred for follow-up with infectious disease.  Final Clinical Impression(s) / ED Diagnoses Final diagnoses:  Screening for STD (sexually transmitted disease)  HIV infection, unspecified symptom status (HCC)    Rx / DC Orders ED Discharge Orders     None                Ernie Avena, MD 01/23/23 1314

## 2023-01-22 NOTE — ED Triage Notes (Signed)
Patient states he is here for routine STD testing, just got out of relationship and "I might as well get testing before getting back out there." Denies any symptoms.

## 2023-01-23 LAB — RPR: RPR Ser Ql: NONREACTIVE

## 2023-01-23 LAB — HIV ANTIBODY (ROUTINE TESTING W REFLEX): HIV Screen 4th Generation wRfx: REACTIVE — AB

## 2023-01-24 ENCOUNTER — Telehealth: Payer: Self-pay

## 2023-01-24 LAB — HIV-1/2 AB - DIFFERENTIATION
HIV 1 Ab: REACTIVE
HIV 2 Ab: UNDETERMINED

## 2023-01-24 LAB — HSV 1 ANTIBODY, IGG: HSV 1 Glycoprotein G Ab, IgG: 16.8 index — ABNORMAL HIGH (ref 0.00–0.90)

## 2023-01-24 LAB — GC/CHLAMYDIA PROBE AMP (~~LOC~~) NOT AT ARMC
Chlamydia: NEGATIVE
Comment: NEGATIVE
Comment: NORMAL
Neisseria Gonorrhea: NEGATIVE

## 2023-01-24 LAB — HSV 2 ANTIBODY, IGG: HSV 2 Glycoprotein G Ab, IgG: <0.91 index (ref 0.00–0.90)

## 2023-01-24 NOTE — Telephone Encounter (Signed)
Patient overdue for appointment, called to schedule. Call cannot be completed. Referral faxed to [facility] to assist with getting him back in care.   Sandie Ano, RN

## 2023-03-21 ENCOUNTER — Telehealth: Payer: Self-pay

## 2023-03-21 NOTE — Telephone Encounter (Signed)
Patient out of care and in need of appointment, called to schedule, no answer. Left HIPAA compliant voicemail requesting callback.   Sandie Ano, RN

## 2024-05-15 ENCOUNTER — Other Ambulatory Visit: Payer: Self-pay

## 2024-05-15 ENCOUNTER — Ambulatory Visit (HOSPITAL_COMMUNITY)
Admission: EM | Admit: 2024-05-15 | Discharge: 2024-05-15 | Disposition: A | Discharge status: Home / Self Care | Attending: Internal Medicine | Admitting: Internal Medicine

## 2024-05-15 ENCOUNTER — Encounter (HOSPITAL_COMMUNITY): Payer: Self-pay

## 2024-05-15 DIAGNOSIS — J339 Nasal polyp, unspecified: Secondary | ICD-10-CM | POA: Diagnosis not present

## 2024-05-15 DIAGNOSIS — H6691 Otitis media, unspecified, right ear: Secondary | ICD-10-CM

## 2024-05-15 MED ORDER — AMOXICILLIN-POT CLAVULANATE 875-125 MG PO TABS
1.0000 | ORAL_TABLET | Freq: Two times a day (BID) | ORAL | 0 refills | Status: DC
  Filled 2024-05-15: qty 14, 7d supply, fill #0

## 2024-05-15 MED ORDER — FLUTICASONE PROPIONATE 50 MCG/ACT NA SUSP
2.0000 | Freq: Every day | NASAL | 0 refills | Status: DC
  Filled 2024-05-15: qty 16, 30d supply, fill #0

## 2024-05-15 MED ORDER — METHYLPREDNISOLONE 4 MG PO TBPK
ORAL_TABLET | ORAL | 0 refills | Status: AC
  Filled 2024-05-15: qty 21, 6d supply, fill #0

## 2024-05-15 NOTE — Discharge Instructions (Signed)
 Follow up with ear nose and throat doctor for the chronic stuffy nose, I believe you have nose polyps.

## 2024-05-15 NOTE — ED Provider Notes (Signed)
 [facility]    CSN: [id number] Arrival date & time: 05/15/24  1519      History   Chief Complaint Chief Complaint  Patient presents with   Hearing Problem    HPI Nathan Tucker is a 23 y.o. 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 Has tried ear drops without relief. He denies pain today, but had it last week. Has chronic stuffy nose since childhood. Has not seen ENT. Has taken Allergy meds with temporary relief.  Denies fever or rhinitis.     Past Medical History:  Diagnosis Date   Gonorrhea 05/30/2020   HIV disease (HCC) 05/30/2020   No pertinent past medical history     Patient Active Problem List   Diagnosis Date Noted   Non-intractable vomiting 11/01/2020   Right upper quadrant abdominal pain 11/01/2020   Immunization counseling 09/29/2020   Encounter for smoking cessation counseling 09/29/2020   HIV disease (HCC) 05/30/2020   Gonorrhea 05/30/2020   Intractable vomiting with nausea 11/23/2019   Intractable vomiting    Left upper quadrant abdominal pain     Past Surgical History:  Procedure Laterality Date   NO PAST SURGERIES         Home Medications    Prior to Admission medications   Medication Sig Start Date End Date Taking? Authorizing Provider  amoxicillin -clavulanate (AUGMENTIN ) 875-125 MG tablet Take 1 tablet by mouth every 12 (twelve) hours. 05/15/24  Yes Rodriguez-Southworth, Trumaine Wimer, PA-C  fluticasone  (FLONASE ) 50 MCG/ACT nasal spray Place 2 sprays into both nostrils daily. 05/15/24  Yes Rodriguez-Southworth, Kyra, PA-C  methylPREDNISolone  (MEDROL  DOSEPAK) 4 MG TBPK tablet Take as directed on package for 6 days. 05/15/24  Yes Rodriguez-Southworth, Bernadetta Roell, PA-C  bictegravir-emtricitabine-tenofovir AF (BIKTARVY ) 50-200-25 MG TABS tablet Take 1 tablet by mouth daily. 09/29/20   Dea Shiner, MD    Family History History reviewed. No  pertinent family history.  Social History Social History   Tobacco Use   Smoking status: Never   Smokeless tobacco: Never  Vaping Use   Vaping status: Every Day  Substance Use Topics   Alcohol use: Yes    Comment: pt reports 40 shots, $300 tab    Drug use: Yes    Frequency: 2.0 times per week    Types: Marijuana     Allergies   Patient has no known allergies.   Review of Systems Review of Systems As noted in HPI  Physical Exam Triage Vital Signs ED Triage Vitals  Encounter Vitals Group     BP 05/15/24 1540 125/73     Girls Systolic BP Percentile --      Girls Diastolic BP Percentile --      Boys Systolic BP Percentile --      Boys Diastolic BP Percentile --      Pulse Rate 05/15/24 1540 87     Resp 05/15/24 1540 18     Temp 05/15/24 1540 99.7 F (37.6 C)     Temp Source 05/15/24 1540 Oral     SpO2 05/15/24 1540 95 %     Weight 05/15/24 1540 203 lb (92.1 kg)     Height 05/15/24 1540 5' 8 (1.727 m)     Head Circumference --      Peak Flow --      Pain Score 05/15/24 1539 0     Pain Loc --      Pain Education --  Exclude from Growth Chart --    No data found.  Updated Vital Signs BP 125/73 (BP Location: Right Arm)   Pulse 87   Temp 99.7 F (37.6 C) (Oral)   Resp 18   Ht 5' 8 (1.727 m)   Wt 203 lb (92.1 kg)   SpO2 95%   BMI 30.87 kg/m   Visual Acuity Right Eye Distance:   Left Eye Distance:   Bilateral Distance:    Right Eye Near:   Left Eye Near:    Bilateral Near:     Physical Exam Alert pt NAD who seems nasally congested EYES- non icterus, mild watering, no purulent drainage NOSE- moderate mucosa congestion which is pale pink with clear mucous. No sinus tenderness. May have a visible polyp on the L. The right side is too swollen to see up high.  TM- L gray and shiny, canals are normal. R TM is flat with yellow matter behind the TM. PHARYNX- clear, clear drainage noted.  NECK- supple with no nodes LUNGS- is a mouth breather, normal  effort.     UC Treatments / Results  Labs (all labs ordered are listed, but only abnormal results are displayed) Labs Reviewed - No data to display  EKG   Radiology No results found.  Procedures Procedures (including critical care time)  Medications Ordered in UC Medications - No data to display  Initial Impression / Assessment and Plan / UC Course  I have reviewed the triage vital signs and the nursing notes.  Acute R OM Nose Congestion, chronic Possible L nose polyp  I placed him on Medrol . Augmentin  and Flonase  as noted. Needs to FU with ENT.    Final Clinical Impressions(s) / UC Diagnoses   Final diagnoses:  Acute otitis media, right  Nasal polyps     Discharge Instructions      Follow up with ear nose and throat doctor for the chronic stuffy nose, I believe you have nose polyps.      ED Prescriptions     Medication Sig Dispense Auth. Provider   fluticasone  (FLONASE ) 50 MCG/ACT nasal spray Place 2 sprays into both nostrils daily. 16 g Rodriguez-Southworth, Amay Mijangos, PA-C   amoxicillin -clavulanate (AUGMENTIN ) 875-125 MG tablet Take 1 tablet by mouth every 12 (twelve) hours. 14 tablet Rodriguez-Southworth, Jesselyn Rask, PA-C   methylPREDNISolone  (MEDROL  DOSEPAK) 4 MG TBPK tablet Take as directed on package for 6 days. 21 tablet Rodriguez-Southworth, Kyra, PA-C      PDMP not reviewed this encounter.   Lindi Kyra, PA-C 05/15/24 1619

## 2024-05-15 NOTE — ED Triage Notes (Signed)
 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 States they have not popped back onset 1 month ago. Tried otc ear drops with no relief. States it is the right ear. Denies any drainage but there was some throbbing pain a week ago.

## 2024-08-07 ENCOUNTER — Emergency Department (HOSPITAL_BASED_OUTPATIENT_CLINIC_OR_DEPARTMENT_OTHER): Admitting: Radiology

## 2024-08-07 ENCOUNTER — Other Ambulatory Visit: Payer: Self-pay

## 2024-08-07 ENCOUNTER — Emergency Department (HOSPITAL_BASED_OUTPATIENT_CLINIC_OR_DEPARTMENT_OTHER)
Admission: EM | Admit: 2024-08-07 | Discharge: 2024-08-07 | Disposition: A | Discharge status: Home / Self Care | Attending: Emergency Medicine | Admitting: Emergency Medicine

## 2024-08-07 DIAGNOSIS — R0602 Shortness of breath: Secondary | ICD-10-CM | POA: Diagnosis not present

## 2024-08-07 DIAGNOSIS — J101 Influenza due to other identified influenza virus with other respiratory manifestations: Secondary | ICD-10-CM | POA: Diagnosis not present

## 2024-08-07 DIAGNOSIS — R059 Cough, unspecified: Secondary | ICD-10-CM | POA: Diagnosis present

## 2024-08-07 LAB — COMPREHENSIVE METABOLIC PANEL WITH GFR
ALT: 33 U/L (ref 0–44)
AST: 91 U/L — ABNORMAL HIGH (ref 15–41)
Albumin: 4.5 g/dL (ref 3.5–5.0)
Alkaline Phosphatase: 67 U/L (ref 38–126)
Anion gap: 14 (ref 5–15)
BUN: 13 mg/dL (ref 6–20)
CO2: 25 mmol/L (ref 22–32)
Calcium: 9.4 mg/dL (ref 8.9–10.3)
Chloride: 93 mmol/L — ABNORMAL LOW (ref 98–111)
Creatinine, Ser: 0.94 mg/dL (ref 0.61–1.24)
GFR, Estimated: >60 mL/min
Glucose, Bld: 94 mg/dL (ref 70–99)
Potassium: 3.5 mmol/L (ref 3.5–5.1)
Sodium: 133 mmol/L — ABNORMAL LOW (ref 135–145)
Total Bilirubin: 0.6 mg/dL (ref 0.0–1.2)
Total Protein: 10 g/dL — ABNORMAL HIGH (ref 6.5–8.1)

## 2024-08-07 LAB — CBC
HCT: 48.4 % (ref 39.0–52.0)
Hemoglobin: 16.6 g/dL (ref 13.0–17.0)
MCH: 29.5 pg (ref 26.0–34.0)
MCHC: 34.3 g/dL (ref 30.0–36.0)
MCV: 86.1 fL (ref 80.0–100.0)
Platelets: 194 K/uL (ref 150–400)
RBC: 5.62 MIL/uL (ref 4.22–5.81)
RDW: 12.6 % (ref 11.5–15.5)
WBC: 5.7 K/uL (ref 4.0–10.5)
nRBC: 0 % (ref 0.0–0.2)

## 2024-08-07 LAB — RESP PANEL BY RT-PCR (RSV, FLU A&B, COVID)  RVPGX2
Influenza A by PCR: POSITIVE — AB
Influenza B by PCR: NEGATIVE
Resp Syncytial Virus by PCR: NEGATIVE
SARS Coronavirus 2 by RT PCR: NEGATIVE

## 2024-08-07 LAB — LIPASE, BLOOD: Lipase: 37 U/L (ref 11–51)

## 2024-08-07 MED ORDER — SODIUM CHLORIDE 0.9 % IV BOLUS
1000.0000 mL | Freq: Once | INTRAVENOUS | Status: AC
  Administered 2024-08-07: 1000 mL via INTRAVENOUS

## 2024-08-07 MED ORDER — ONDANSETRON 4 MG PO TBDP: 4.0000 mg | ORAL_TABLET | Freq: Three times a day (TID) | ORAL | 0 refills | Status: AC | PRN

## 2024-08-07 MED ORDER — ONDANSETRON HCL 4 MG/2ML IJ SOLN
4.0000 mg | Freq: Once | INTRAMUSCULAR | Status: AC
  Administered 2024-08-07: 4 mg via INTRAVENOUS
  Filled 2024-08-07: qty 2

## 2024-08-07 MED ORDER — KETOROLAC TROMETHAMINE 30 MG/ML IJ SOLN
15.0000 mg | Freq: Once | INTRAMUSCULAR | Status: AC
  Administered 2024-08-07: 15 mg via INTRAVENOUS
  Filled 2024-08-07: qty 1

## 2024-08-07 NOTE — ED Provider Notes (Signed)
 " [name] Provider Note   CSN: [id number] Arrival date & time: 08/07/24  1824     Patient presents with: Vomiting and Shortness of Breath   Nathan Tucker is a 23 y.o. male.   Patient to ED with symptoms for the past 4-5 days of cough, SOB, fever, excessive fatigue, nausea and vomiting making oral intake difficult. He reports he has not urinated in 3 days. No diarrhea but reports his stool appears green. Multiple known sick contacts. He also reports ongoing right ear pressure and hearing changes that are chronic. Has been previously treated with antibiotics and a nasal spray in the past but symptoms recur/persist.   The history is provided by the patient. No language interpreter was used.  Shortness of Breath      Prior to Admission medications  Medication Sig Start Date End Date Taking? Authorizing Provider  ondansetron  (ZOFRAN -ODT) 4 MG disintegrating tablet Take 1 tablet (4 mg total) by mouth every 8 (eight) hours as needed for nausea or vomiting. 08/07/24  Yes Kecia Swoboda, PA-C  amoxicillin -clavulanate (AUGMENTIN ) 875-125 MG tablet Take 1 tablet by mouth every 12 (twelve) hours. 05/15/24   Rodriguez-Southworth, Sylvia, PA-C  bictegravir-emtricitabine-tenofovir AF (BIKTARVY ) 50-200-25 MG TABS tablet Take 1 tablet by mouth daily. 09/29/20   Manandhar, Sabina, MD  fluticasone  (FLONASE ) 50 MCG/ACT nasal spray Place 2 sprays into both nostrils daily. 05/15/24   Rodriguez-Southworth, Sylvia, PA-C  methylPREDNISolone  (MEDROL  DOSEPAK) 4 MG TBPK tablet Take as directed on package for 6 days. 05/15/24   Rodriguez-Southworth, Sylvia, PA-C    Allergies: Patient has no known allergies.    Review of Systems  Respiratory:  Positive for shortness of breath.     Updated Vital Signs BP 124/69   Pulse 75   Temp 98.9 F (37.2 C)   Resp 13   Ht 5' 8 (1.727 m)   Wt 81.6 kg   SpO2 97%   BMI 27.37 kg/m   Physical Exam Vitals and nursing  note reviewed.  Constitutional:      Appearance: He is well-developed.  HENT:     Head: Normocephalic.     Right Ear: Tympanic membrane normal.     Left Ear: Tympanic membrane normal.     Ears:     Comments: There is cerumen build up in the left ear but not occlusive. TM appears normal.     Nose: Nose normal.     Mouth/Throat:     Mouth: Mucous membranes are dry.  Cardiovascular:     Rate and Rhythm: Normal rate and regular rhythm.     Heart sounds: No murmur heard. Pulmonary:     Effort: Pulmonary effort is normal.     Breath sounds: Normal breath sounds. No wheezing, rhonchi or rales.  Abdominal:     General: Bowel sounds are normal.     Palpations: Abdomen is soft.     Tenderness: There is no abdominal tenderness. There is no guarding or rebound.  Musculoskeletal:        General: Normal range of motion.     Cervical back: Normal range of motion and neck supple.  Skin:    General: Skin is warm and dry.     Findings: No rash.  Neurological:     General: No focal deficit present.     Mental Status: He is alert and oriented to person, place, and time.     (all labs ordered are listed, but only abnormal results are displayed) Labs Reviewed  RESP PANEL BY RT-PCR (RSV, FLU A&B, COVID)  RVPGX2 - Abnormal; Notable for the following components:      Result Value   Influenza A by PCR POSITIVE (*)    All other components within normal limits  COMPREHENSIVE METABOLIC PANEL WITH GFR - Abnormal; Notable for the following components:   Sodium 133 (*)    Chloride 93 (*)    Total Protein 10.0 (*)    AST 91 (*)    All other components within normal limits  LIPASE, BLOOD  CBC    EKG: None  Radiology: DG Chest 2 View Result Date: 08/07/2024 CLINICAL DATA:  Shortness of breath EXAM: CHEST - 2 VIEW COMPARISON:  11/23/2022 FINDINGS: The heart size and mediastinal contours are within normal limits. Both lungs are clear. The visualized skeletal structures are unremarkable.  IMPRESSION: No active cardiopulmonary disease. Electronically Signed   By: Oneil Devonshire M.D.   On: 08/07/2024 19:35     Procedures   Medications Ordered in the ED  sodium chloride  0.9 % bolus 1,000 mL (0 mLs Intravenous Stopped 08/07/24 2211)  ondansetron  (ZOFRAN ) injection 4 mg (4 mg Intravenous Given 08/07/24 2104)  ketorolac  (TORADOL ) 30 MG/ML injection 15 mg (15 mg Intravenous Given 08/07/24 2105)  sodium chloride  0.9 % bolus 1,000 mL (0 mLs Intravenous Stopped 08/07/24 2341)    Clinical Course as of 08/07/24 2359  Fri Aug 07, 2024  2250 Patient to ED with ss/sxs as per HPI. He reports significant decreased urination, stating no urinating in 3 days due to N, V and very little PO intake.   Viral panel positive for influenza. IV fluids ordered. On recheck on one liter, he is sitting up, much better appearing. He is drinking fluids, snacking. 2nd liter ordered. Anticipate discharge after 2nd liter and once he produces urine.  [SU]  2358 Patient able to urinate after 2nd liter. He is feeling much better. No further nausea with Zofran  provided. Will give Rx for same. Supportive care discussed.  [SU]    Clinical Course User Index [SU] Odell Balls, PA-C                                 Medical Decision Making Amount and/or Complexity of Data Reviewed Labs: ordered. Radiology: ordered.  Risk Prescription drug management.        Final diagnoses:  Influenza A    ED Discharge Orders          Ordered    ondansetron  (ZOFRAN -ODT) 4 MG disintegrating tablet  Every 8 hours PRN        08/07/24 2340               Odell Balls, PA-C 08/07/24 2359    Pamella Ozell LABOR, DO 08/13/24 1303  "

## 2024-08-07 NOTE — ED Triage Notes (Addendum)
 Arrives ambulatory to the ED with complaints of worsening cough, shortness of breath, and fatigue for 5 days. He reports vomiting as well.  Patient reports several sick contacts at his job.

## 2024-08-07 NOTE — Discharge Instructions (Signed)
 As we discussed, use Zofran  every 8 hours for nausea as needed. Push fluids - enough to keep your urine a light yellow. Get lots of rest. Take tylenol  and ibuprofen for aches and any fever.   It is recommended that you get a primary care provided for routine care, including ongoing nasal/sinus congestion.   Return to the ED with any new or concerning symptoms at any time.

## 2024-08-07 NOTE — ED Notes (Incomplete)
 Arrives ambulatory to the ED

## 2024-08-07 NOTE — ED Notes (Signed)
 Reviewed discharge instructions, medications, and home care with pt. Pt verbalized understanding and had no further questions. Pt exited ED without complications.

## 2024-08-07 NOTE — ED Notes (Signed)
 ED Provider at bedside.

## 2024-09-12 ENCOUNTER — Other Ambulatory Visit (HOSPITAL_BASED_OUTPATIENT_CLINIC_OR_DEPARTMENT_OTHER): Payer: Self-pay

## 2024-09-12 ENCOUNTER — Encounter (HOSPITAL_BASED_OUTPATIENT_CLINIC_OR_DEPARTMENT_OTHER): Payer: Self-pay

## 2024-09-12 ENCOUNTER — Emergency Department (HOSPITAL_BASED_OUTPATIENT_CLINIC_OR_DEPARTMENT_OTHER)
Admission: EM | Admit: 2024-09-12 | Discharge: 2024-09-12 | Disposition: A | Discharge status: Home / Self Care | Attending: Emergency Medicine | Admitting: Emergency Medicine

## 2024-09-12 DIAGNOSIS — H6693 Otitis media, unspecified, bilateral: Secondary | ICD-10-CM | POA: Diagnosis not present

## 2024-09-12 DIAGNOSIS — Z79899 Other long term (current) drug therapy: Secondary | ICD-10-CM | POA: Diagnosis not present

## 2024-09-12 DIAGNOSIS — H9203 Otalgia, bilateral: Secondary | ICD-10-CM | POA: Diagnosis present

## 2024-09-12 DIAGNOSIS — H6690 Otitis media, unspecified, unspecified ear: Secondary | ICD-10-CM

## 2024-09-12 MED ORDER — DEXAMETHASONE 4 MG PO TABS
10.0000 mg | ORAL_TABLET | Freq: Once | ORAL | Status: AC
  Administered 2024-09-12: 10 mg via ORAL
  Filled 2024-09-12: qty 3

## 2024-09-12 MED ORDER — FLUTICASONE PROPIONATE 50 MCG/ACT NA SUSP
2.0000 | Freq: Every day | NASAL | 0 refills | Status: AC
  Filled 2024-09-12: qty 16, 30d supply, fill #0

## 2024-09-12 MED ORDER — AMOXICILLIN-POT CLAVULANATE 875-125 MG PO TABS
1.0000 | ORAL_TABLET | Freq: Two times a day (BID) | ORAL | 0 refills | Status: AC
  Filled 2024-09-12: qty 14, 7d supply, fill #0

## 2024-09-12 NOTE — ED Provider Notes (Signed)
 " [place] EMERGENCY DEPARTMENT AT [facility] Provider Note   CSN: [id number] Arrival date & time: 09/12/24  9258     Patient presents with: Otalgia   Nathan Tucker is a 24 y.o. male.   Patient here with bilateral ear pain nasal congestion.  Unremarkable vitals.  No fever.  Recently had the flu.  Denies any cough sputum production sore throat.  History of chronic congestion in his nose.  Denies any weakness numbness tingling.  No headache.  The history is provided by the patient.       Prior to Admission medications  Medication Sig Start Date End Date Taking? Authorizing Provider  amoxicillin -clavulanate (AUGMENTIN ) 875-125 MG tablet Take 1 tablet by mouth every 12 (twelve) hours. 09/12/24  Yes Heyward Douthit, DO  bictegravir-emtricitabine-tenofovir AF (BIKTARVY ) 50-200-25 MG TABS tablet Take 1 tablet by mouth daily. 09/29/20   Manandhar, Sabina, MD  fluticasone  (FLONASE ) 50 MCG/ACT nasal spray Place 2 sprays into both nostrils daily. 09/12/24   Keshonna Valvo, DO  methylPREDNISolone  (MEDROL  DOSEPAK) 4 MG TBPK tablet Take as directed on package for 6 days. 05/15/24   Rodriguez-Southworth, Sylvia, PA-C  ondansetron  (ZOFRAN -ODT) 4 MG disintegrating tablet Take 1 tablet (4 mg total) by mouth every 8 (eight) hours as needed for nausea or vomiting. 08/07/24   Odell Balls, PA-C    Allergies: Patient has no known allergies.    Review of Systems  Updated Vital Signs BP (!) 154/98 (BP Location: Right Arm)   Pulse (!) 102   Temp 99 F (37.2 C)   Resp 17   SpO2 98%   Physical Exam Vitals and nursing note reviewed.  Constitutional:      General: He is not in acute distress.    Appearance: He is well-developed.  HENT:     Head: Normocephalic and atraumatic.     Comments: Discussed fluid behind his right ear with little bit of redness of the TM but no obvious rupture of the tympanic membrane, left tympanic membrane is unremarkable, nasal congestion on exam no signs  erythema to the back of the throat    Nose: Congestion present.     Mouth/Throat:     Mouth: Mucous membranes are moist.  Eyes:     Extraocular Movements: Extraocular movements intact.     Conjunctiva/sclera: Conjunctivae normal.  Cardiovascular:     Rate and Rhythm: Normal rate and regular rhythm.     Heart sounds: No murmur heard. Pulmonary:     Effort: Pulmonary effort is normal. No respiratory distress.     Breath sounds: Normal breath sounds.  Abdominal:     Palpations: Abdomen is soft.     Tenderness: There is no abdominal tenderness.  Musculoskeletal:        General: No swelling.     Cervical back: Neck supple.  Skin:    General: Skin is warm and dry.     Capillary Refill: Capillary refill takes less than 2 seconds.  Neurological:     Mental Status: He is alert.  Psychiatric:        Mood and Affect: Mood normal.     (all labs ordered are listed, but only abnormal results are displayed) Labs Reviewed - No data to display  EKG: None  Radiology: No results found.   Procedures   Medications Ordered in the ED  dexamethasone  (DECADRON ) tablet 10 mg (has no administration in time range)  Medical Decision Making Risk Prescription drug management.   Nathan Tucker is here with nasal congestion ear pain.  Looks like he has an otitis media on exam.  Do not see any tympanic membrane perforation.  No throat pain no sign of throat infection on exam.  Overall we will give a dose of Decadron  will give Flonase  and Augmentin  have him follow-up with his primary care he is already been referred to ENT for his congestion in the past.  He is nontoxic-appearing.  Reassuring vitals.  No fever.  Discharge.  This chart was dictated using voice recognition software.  Despite best efforts to proofread,  errors can occur which can change the documentation meaning.      Final diagnoses:  Acute otitis media, unspecified otitis media type     ED Discharge Orders          Ordered    amoxicillin -clavulanate (AUGMENTIN ) 875-125 MG tablet  Every 12 hours        09/12/24 0835    fluticasone  (FLONASE ) 50 MCG/ACT nasal spray  Daily        09/12/24 0835               Ruthe Cornet, DO 09/12/24 9162  "

## 2024-09-12 NOTE — ED Triage Notes (Signed)
 He c/o right earache x 1-2 days, along with diminution of hearing. He also c/o diminution of hearing in his left ear for about a month now. He states he had the flu last week, but now I'm completely over that. He denies fever.
